# Patient Record
Sex: Female | Born: 1950
Health system: Southern US, Community
[De-identification: ages and names within clinical notes are randomized; demographics above are authoritative.]

## PROBLEM LIST (undated history)

## (undated) DIAGNOSIS — IMO0001 Reserved for inherently not codable concepts without codable children: Secondary | ICD-10-CM

## (undated) DIAGNOSIS — K219 Gastro-esophageal reflux disease without esophagitis: Secondary | ICD-10-CM

## (undated) DIAGNOSIS — C44320 Squamous cell carcinoma of skin of unspecified parts of face: Secondary | ICD-10-CM

## (undated) DIAGNOSIS — E611 Iron deficiency: Secondary | ICD-10-CM

## (undated) DIAGNOSIS — M7502 Adhesive capsulitis of left shoulder: Secondary | ICD-10-CM

## (undated) DIAGNOSIS — M797 Fibromyalgia: Secondary | ICD-10-CM

## (undated) DIAGNOSIS — K589 Irritable bowel syndrome without diarrhea: Secondary | ICD-10-CM

## (undated) DIAGNOSIS — T7840XA Allergy, unspecified, initial encounter: Secondary | ICD-10-CM

## (undated) DIAGNOSIS — G43909 Migraine, unspecified, not intractable, without status migrainosus: Secondary | ICD-10-CM

## (undated) DIAGNOSIS — H409 Unspecified glaucoma: Secondary | ICD-10-CM

## (undated) DIAGNOSIS — F419 Anxiety disorder, unspecified: Secondary | ICD-10-CM

## (undated) DIAGNOSIS — J45909 Unspecified asthma, uncomplicated: Secondary | ICD-10-CM

## (undated) DIAGNOSIS — R001 Bradycardia, unspecified: Secondary | ICD-10-CM

## (undated) DIAGNOSIS — K635 Polyp of colon: Secondary | ICD-10-CM

## (undated) HISTORY — DX: Anxiety disorder, unspecified: F41.9

## (undated) HISTORY — DX: Reserved for inherently not codable concepts without codable children: IMO0001

## (undated) HISTORY — DX: Adhesive capsulitis of left shoulder: M75.02

## (undated) HISTORY — PX: MOHS SURGERY: SHX181

## (undated) HISTORY — DX: Fibromyalgia: M79.7

## (undated) HISTORY — DX: Migraine, unspecified, not intractable, without status migrainosus: G43.909

## (undated) HISTORY — DX: Unspecified glaucoma: H40.9

## (undated) HISTORY — PX: TONSILLECTOMY: SUR1361

## (undated) HISTORY — DX: Squamous cell carcinoma of skin of unspecified parts of face: C44.320

## (undated) HISTORY — DX: Iron deficiency: E61.1

## (undated) HISTORY — DX: Unspecified asthma, uncomplicated: J45.909

## (undated) HISTORY — DX: Gastro-esophageal reflux disease without esophagitis: K21.9

## (undated) HISTORY — DX: Bradycardia, unspecified: R00.1

## (undated) HISTORY — DX: Allergy, unspecified, initial encounter: T78.40XA

## (undated) HISTORY — DX: Polyp of colon: K63.5

## (undated) HISTORY — DX: Irritable bowel syndrome, unspecified: K58.9

---
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
## 1997-03-07 HISTORY — PX: TENNIS ELBOW RELEASE/NIRSCHEL PROCEDURE: SHX6651

## 1997-07-10 ENCOUNTER — Ambulatory Visit (HOSPITAL_COMMUNITY): Admission: RE | Admit: 1997-07-10 | Discharge: 1997-07-10 | Payer: Self-pay | Admitting: Internal Medicine

## 1997-08-29 ENCOUNTER — Other Ambulatory Visit: Admission: RE | Admit: 1997-08-29 | Discharge: 1997-08-29 | Payer: Self-pay | Admitting: Obstetrics and Gynecology

## 1998-01-20 ENCOUNTER — Other Ambulatory Visit: Admission: RE | Admit: 1998-01-20 | Discharge: 1998-01-20 | Payer: Self-pay | Admitting: Obstetrics and Gynecology

## 1998-02-19 ENCOUNTER — Other Ambulatory Visit: Admission: RE | Admit: 1998-02-19 | Discharge: 1998-02-19 | Payer: Self-pay | Admitting: Obstetrics and Gynecology

## 1998-09-24 ENCOUNTER — Ambulatory Visit (HOSPITAL_BASED_OUTPATIENT_CLINIC_OR_DEPARTMENT_OTHER): Admission: RE | Admit: 1998-09-24 | Discharge: 1998-09-24 | Payer: Self-pay | Admitting: Oral Surgery

## 1999-01-22 ENCOUNTER — Other Ambulatory Visit: Admission: RE | Admit: 1999-01-22 | Discharge: 1999-01-22 | Payer: Self-pay | Admitting: Obstetrics and Gynecology

## 2000-02-15 ENCOUNTER — Other Ambulatory Visit: Admission: RE | Admit: 2000-02-15 | Discharge: 2000-02-15 | Payer: Self-pay | Admitting: Obstetrics and Gynecology

## 2001-02-22 ENCOUNTER — Other Ambulatory Visit: Admission: RE | Admit: 2001-02-22 | Discharge: 2001-02-22 | Payer: Self-pay | Admitting: Obstetrics and Gynecology

## 2001-07-03 ENCOUNTER — Encounter (INDEPENDENT_AMBULATORY_CARE_PROVIDER_SITE_OTHER): Payer: Self-pay | Admitting: *Deleted

## 2001-07-03 ENCOUNTER — Ambulatory Visit (HOSPITAL_BASED_OUTPATIENT_CLINIC_OR_DEPARTMENT_OTHER): Admission: RE | Admit: 2001-07-03 | Discharge: 2001-07-03 | Payer: Self-pay | Admitting: Plastic Surgery

## 2002-05-01 ENCOUNTER — Other Ambulatory Visit: Admission: RE | Admit: 2002-05-01 | Discharge: 2002-05-01 | Payer: Self-pay | Admitting: Obstetrics and Gynecology

## 2003-05-26 ENCOUNTER — Other Ambulatory Visit: Admission: RE | Admit: 2003-05-26 | Discharge: 2003-05-26 | Payer: Self-pay | Admitting: Obstetrics and Gynecology

## 2003-11-07 ENCOUNTER — Other Ambulatory Visit: Admission: RE | Admit: 2003-11-07 | Discharge: 2003-11-07 | Payer: Self-pay | Admitting: Obstetrics and Gynecology

## 2004-05-26 ENCOUNTER — Other Ambulatory Visit: Admission: RE | Admit: 2004-05-26 | Discharge: 2004-05-26 | Payer: Self-pay | Admitting: Obstetrics and Gynecology

## 2005-06-01 ENCOUNTER — Other Ambulatory Visit: Admission: RE | Admit: 2005-06-01 | Discharge: 2005-06-01 | Payer: Self-pay | Admitting: Obstetrics and Gynecology

## 2008-11-17 ENCOUNTER — Encounter: Admission: RE | Admit: 2008-11-17 | Discharge: 2008-11-17 | Payer: Self-pay | Admitting: Orthopedic Surgery

## 2009-06-15 ENCOUNTER — Encounter: Admission: RE | Admit: 2009-06-15 | Discharge: 2009-06-15 | Payer: Self-pay | Admitting: Orthopedic Surgery

## 2009-11-06 ENCOUNTER — Encounter: Admission: RE | Admit: 2009-11-06 | Discharge: 2009-11-06 | Payer: Self-pay | Admitting: Allergy and Immunology

## 2010-07-23 NOTE — Op Note (Signed)
Brenda Smith. [facility]  Patient:    [name], [name] Visit Number: [id number] MRN: [id number]          Service Type: DSU Location: [facility] Attending Physician:  Eloise Levels Dictated by:   Mary A. Contogiannis, M.D. Proc. Date: 07/03/01 Admit Date:  07/03/2001 Discharge Date: 07/03/2001                             Operative Report  PREOPERATIVE DIAGNOSIS:  Squamous cell carcinoma in situ, left chin.  POSTOPERATIVE DIAGNOSIS:  Squamous cell carcinoma in situ, left chin.  PROCEDURES: 1. Excision of 0.9-cm squamous cell carcinoma in situ, left chin, with    intraoperative frozen section diagnosis. 2. Intermediate repair of 1.2-cm incision, left chin.  ATTENDING SURGEON:  Mary A. Contogiannis, M.D.  ANESTHESIA:  One percent lidocaine with epinephrine.  COMPLICATIONS:  None.  INDICATIONS FOR THE PROCEDURE:  The patient is a 60 year old Caucasian female who has a biopsy-proven squamous cell carcinoma in situ of the left chin.  She now presents and requests to have this excised.  PROCEDURE:  The patient was brought into the minor procedure room, placed on the table in the supine position.  The face was prepped with Betadine and draped in sterile fashion.  The skin and subcutaneous tissues in the area of the squamous cell cancer was then injected with 1% lidocaine with epinephrine.  After adequate hemostasis and anesthesia had taken effect, the procedure was begun.  Using loupe magnification, the squamous cell cancer and its biopsy site were identified.  A 1-mm margin was then marked circumferentially around the lesion.  This was then excised, and the specimen marked at the 12 oclock position.  The specimen was then sent to the pathologist for an intraoperative frozen section diagnosis.  After a consultation with the pathologist, he felt that all of the squamous cell cancer had been removed and that the surgical margins were clear and free of any  tumor.  I, therefore, proceeded with closure of the wound.  The wound appeared to close easiest in the vertical direction.  The deep subcutaneous tissues were closed with a 4-0 Monocryl suture.  The dermal layer was then closed using 4-0 Monocryl interrupted sutures.  The skin was then closed with a 6-0 Prolene running baseball-type stitch.  The incision was dressed with bacitracin ointment and a light bandage.  There were no complications.  The patient tolerated the procedure well.  The patient was then taught proper wound care and discharged home in stable condition.  Follow-up appointment will be tomorrow in the office. Dictated by:   Mary A. Contogiannis, M.D. Attending Physician:  Eloise Levels DD:  07/05/01 TD:  07/08/01 Job: [id number]

## 2011-10-13 ENCOUNTER — Ambulatory Visit (INDEPENDENT_AMBULATORY_CARE_PROVIDER_SITE_OTHER): Payer: BC Managed Care – PPO | Admitting: Licensed Clinical Social Worker

## 2011-10-13 DIAGNOSIS — IMO0002 Reserved for concepts with insufficient information to code with codable children: Secondary | ICD-10-CM

## 2011-10-20 ENCOUNTER — Ambulatory Visit: Payer: Self-pay | Admitting: Licensed Clinical Social Worker

## 2011-11-03 ENCOUNTER — Ambulatory Visit (INDEPENDENT_AMBULATORY_CARE_PROVIDER_SITE_OTHER): Payer: BC Managed Care – PPO | Admitting: Licensed Clinical Social Worker

## 2011-11-03 DIAGNOSIS — IMO0002 Reserved for concepts with insufficient information to code with codable children: Secondary | ICD-10-CM

## 2011-11-17 ENCOUNTER — Ambulatory Visit (INDEPENDENT_AMBULATORY_CARE_PROVIDER_SITE_OTHER): Payer: BC Managed Care – PPO | Admitting: Licensed Clinical Social Worker

## 2011-11-17 DIAGNOSIS — IMO0002 Reserved for concepts with insufficient information to code with codable children: Secondary | ICD-10-CM

## 2011-12-01 ENCOUNTER — Ambulatory Visit (INDEPENDENT_AMBULATORY_CARE_PROVIDER_SITE_OTHER): Payer: BC Managed Care – PPO | Admitting: Licensed Clinical Social Worker

## 2011-12-01 DIAGNOSIS — IMO0002 Reserved for concepts with insufficient information to code with codable children: Secondary | ICD-10-CM

## 2011-12-20 DIAGNOSIS — M79609 Pain in unspecified limb: Secondary | ICD-10-CM | POA: Insufficient documentation

## 2011-12-20 DIAGNOSIS — G43009 Migraine without aura, not intractable, without status migrainosus: Secondary | ICD-10-CM | POA: Insufficient documentation

## 2012-07-17 ENCOUNTER — Encounter: Payer: Self-pay | Admitting: Neurology

## 2012-07-17 DIAGNOSIS — G43009 Migraine without aura, not intractable, without status migrainosus: Secondary | ICD-10-CM

## 2012-07-17 DIAGNOSIS — M79609 Pain in unspecified limb: Secondary | ICD-10-CM

## 2012-07-18 ENCOUNTER — Encounter: Payer: Self-pay | Admitting: Neurology

## 2012-07-18 ENCOUNTER — Ambulatory Visit (INDEPENDENT_AMBULATORY_CARE_PROVIDER_SITE_OTHER): Payer: BC Managed Care – PPO | Admitting: Neurology

## 2012-07-18 VITALS — BP 110/74 | HR 50 | Ht 63.0 in | Wt 119.0 lb

## 2012-07-18 DIAGNOSIS — G43009 Migraine without aura, not intractable, without status migrainosus: Secondary | ICD-10-CM

## 2012-07-18 DIAGNOSIS — IMO0001 Reserved for inherently not codable concepts without codable children: Secondary | ICD-10-CM

## 2012-07-18 DIAGNOSIS — M791 Myalgia, unspecified site: Secondary | ICD-10-CM | POA: Insufficient documentation

## 2012-07-18 HISTORY — DX: Reserved for inherently not codable concepts without codable children: IMO0001

## 2012-07-18 MED ORDER — TIZANIDINE HCL 2 MG PO TABS: 1.0000 mg | ORAL_TABLET | Freq: Three times a day (TID) | ORAL | Status: DC | PRN

## 2012-07-18 NOTE — Progress Notes (Signed)
Reason for visit: Fibromyalgia  Brenda Smith is an 62 y.o. female  History of present illness:  Brenda Smith is a 62 year old right-handed white female with a history of migraine headaches and fibromyalgia. Since last seen, the patient has done relatively well, and she indicates that her true migraine headaches are quite infrequent, and have occurred only on 3 or 4 occasions since August of 2013. The patient indicates however, that she is having virtually daily headaches that occur in the middle the night, and these occur on the left side the head and are occasionally associated with nausea. The patient will use ice packs and muscle relaxation techniques for these headaches. The patient will take tizanidine, one half of a 2 mg tablet for her fibromyalgia pain, and she has noted some improvement with this medication. The patient has not been able to tolerate multiple medications previously that includes nortriptyline, Topamax, propranolol, Zonegran, Lexapro, or Zoloft. The patient returns for an evaluation.  Past Medical History  Diagnosis Date  . Migraine headache   . Anxiety disorder   . IBS (irritable bowel syndrome)   . Myalgia and myositis, unspecified     Past Surgical History  Procedure Laterality Date  . Tonsillectomy    . Mohs surgery      Squamous cell    Family History  Problem Relation Age of Onset  . Heart attack Mother   . Cancer Mother   . Heart attack Father   . Gout Father   . Anxiety disorder Father   . Heart disease Brother   . Headache Maternal Grandmother   . Heart disease Maternal Grandmother     Social history:  reports that she has never smoked. She does not have any smokeless tobacco history on file. She reports that  drinks alcohol. Her drug history is not on file.  Allergies:  Allergies  Allergen Reactions  . Penicillins   . Pamelor (Nortriptyline Hcl)   . Topamax (Topiramate)     Medications:  No current outpatient prescriptions on  file prior to visit.   No current facility-administered medications on file prior to visit.    ROS:  Out of a complete 14 system review of symptoms, the patient complains only of the following symptoms, and all other reviewed systems are negative.  Fatigue Ringing in the ears Shortness of breath Feeling cold, thirst Allergies, skin sensitivity Headache Anxiety, insomnia  Blood pressure 110/74, pulse 50, height 5\' 3"  (1.6 m), weight 119 lb (53.978 kg).  Physical Exam  General: The patient is alert and cooperative at the time of the examination.  Skin: No significant peripheral edema is noted.   Neurologic Exam  Cranial nerves: Facial symmetry is present. Speech is normal, no aphasia or dysarthria is noted. Extraocular movements are full. Visual fields are full.  Motor: The patient has good strength in all 4 extremities.  Coordination: The patient has good finger-nose-finger and heel-to-shin bilaterally.  Gait and station: The patient has a normal gait. Tandem gait is normal. Romberg is negative. No drift is seen.  Reflexes: Deep tendon reflexes are symmetric.   Assessment/Plan:  1. Migraine headache  2. Fibromyalgia  The patient will begin taking tizanidine, one half of a 2 mg tablet at night on a scheduled basis. The patient also takes lorazepam, one half of a 1 mg tablet at night. The patient will followup through this office in 6 months. Overall, the patient has done fairly well. A prescription was sent in for the tizanidine.  Frederico Hamman  Anne Hahn MD 07/18/2012 8:09 PM  [facility] [address] [place], [place] [address]  Phone [phone] Fax [phone]

## 2013-01-17 ENCOUNTER — Encounter: Payer: Self-pay | Admitting: Nurse Practitioner

## 2013-01-17 ENCOUNTER — Ambulatory Visit (INDEPENDENT_AMBULATORY_CARE_PROVIDER_SITE_OTHER): Payer: BC Managed Care – PPO | Admitting: Nurse Practitioner

## 2013-01-17 VITALS — BP 110/70 | HR 53 | Temp 97.6°F | Ht 63.0 in | Wt 123.0 lb

## 2013-01-17 DIAGNOSIS — F411 Generalized anxiety disorder: Secondary | ICD-10-CM

## 2013-01-17 DIAGNOSIS — IMO0001 Reserved for inherently not codable concepts without codable children: Secondary | ICD-10-CM

## 2013-01-17 DIAGNOSIS — G43009 Migraine without aura, not intractable, without status migrainosus: Secondary | ICD-10-CM

## 2013-01-17 MED ORDER — TIZANIDINE HCL 2 MG PO TABS: 1.0000 mg | ORAL_TABLET | Freq: Three times a day (TID) | ORAL | Status: DC | PRN

## 2013-01-17 NOTE — Progress Notes (Signed)
[facility]  PATIENT: Brenda Smith DOB: June 05, 1950   REASON FOR VISIT: follow up HISTORY FROM: patient  HISTORY OF PRESENT ILLNESS: 07/18/12 Brenda Smith):  Brenda Smith is a 62 year old right-handed white female with a history of migraine headaches and fibromyalgia. Since last seen, the patient has done relatively well, and she indicates that her true migraine headaches are quite infrequent, and have occurred only on 3 or 4 occasions since August of 2013. The patient indicates however, that she is having virtually daily headaches that occur in the middle the night, and these occur on the left side the head and are occasionally associated with nausea. The patient will use ice packs and muscle relaxation techniques for these headaches. The patient will take tizanidine, one half of a 2 mg tablet for her fibromyalgia pain, and she has noted some improvement with this medication. The patient has not been able to tolerate multiple medications previously that includes nortriptyline, Topamax, propranolol, Zonegran, Lexapro, or Zoloft. The patient returns for an evaluation.  UPDATE 01/17/13 (LL):  Brenda Smith returns for follow up for migraines and fibromyalgia-type pain.  Since last visit, she has continued to have almost nightly left-temporal headache that wakes her between 3-4 am with associated symptoms of nausea, chills, and eye-watering.  She states these headaches usually last about an hour.  She has used ice packs and relaxation exercises to deal with these headaches.  She has been "hesitant" to use the Tizanidine for pain, because she states she is very sensitive to medication.  Over the past months she has had many older aches and pains resurface, (sacral, in-between shoulders) which she sought treatment for at [facility] in [place].  She has been involved in yoga, massage and mediation to help get her symptoms under control.  She admits having a lot of anxieties and  had trouble adjusting to life without working full-time.  REVIEW OF SYSTEMS: Full 14 system review of systems performed and notable only for: headaches, anxiety  ALLERGIES: Allergies  Allergen Reactions  . Penicillins   . Pamelor [Nortriptyline Hcl]   . Topamax [Topiramate]     HOME MEDICATIONS: Outpatient Prescriptions Prior to Visit  Medication Sig Dispense Refill  . cetirizine (ZYRTEC) 10 MG tablet Take 10 mg by mouth daily as needed.       Marland Kitchen HYDROcodone-acetaminophen (NORCO) 10-325 MG per tablet Take 1 tablet by mouth every 6 (six) hours as needed for pain.      Marland Kitchen LORazepam (ATIVAN) 1 MG tablet Take 0.5 mg by mouth every 8 (eight) hours as needed. 1 1/2 tablet daily      . tiZANidine (ZANAFLEX) 2 MG tablet Take 0.5 tablets (1 mg total) by mouth every 8 (eight) hours as needed.  45 tablet  5   . fluocinonide ointment (LIDEX) 0.05 %    Sig: Apply 1 application topically at bedtime as needed.  . desonide (DESOWEN) 0.05 % ointment    Sig: Apply 1 application topically 2 (two) times daily.    PAST MEDICAL HISTORY: Past Medical History  Diagnosis Date  . Migraine headache   . Anxiety disorder   . IBS (irritable bowel syndrome)   . Myalgia and myositis, unspecified     PAST SURGICAL HISTORY: Past Surgical History  Procedure Laterality Date  . Tonsillectomy    . Mohs surgery      Squamous cell    FAMILY HISTORY: Family History  Problem Relation Age of Onset  . Heart attack Mother   . Cancer  Mother   . Heart attack Father   . Gout Father   . Anxiety disorder Father   . Heart disease Brother   . Headache Maternal Grandmother   . Heart disease Maternal Grandmother     SOCIAL HISTORY: History   Social History  . Marital Status: Married    Spouse Name: N/A    Number of Children: 0  . Years of Education: PHD   Occupational History  . Not on file.   Social History Main Topics  . Smoking status: Never Smoker   . Smokeless tobacco: Not on file  . Alcohol Use:  Yes     Comment: Drinks alcohol on occasion  . Drug Use: No  . Sexual Activity: No   Other Topics Concern  . Not on file   Social History Narrative  . No narrative on file     PHYSICAL EXAM  Filed Vitals:   01/17/13 1020  BP: 110/70  Pulse: 53  Temp: 97.6 F (36.4 C)  TempSrc: Oral  Height: 5\' 3"  (1.6 m)  Weight: 123 lb (55.792 kg)   Body mass index is 21.79 kg/(m^2).  Generalized: Well developed, in no acute distress  Head: normocephalic and atraumatic. Oropharynx benign  Neck: Supple, no carotid bruits  Cardiac: Regular rate rhythm, no murmur  Musculoskeletal: No deformity   Neurological examination  Mentation: Alert oriented to time, place, history taking. Follows all commands speech and language fluent Cranial nerve II-XII: Pupils were equal round reactive to light extraocular movements were full, visual field were full on confrontational test. Facial sensation and strength were normal. hearing was intact to finger rubbing bilaterally. Uvula tongue midline. head turning and shoulder shrug and were normal and symmetric.Tongue protrusion into cheek strength was normal. Motor: normal bulk and tone, full strength in the BUE, BLE, fine finger movements normal, no pronator drift. No focal weakness Sensory: normal and symmetric to light touch Coordination: normal finger-nose-finger, heel-to-shin bilaterally, no dysmetria Reflexes:  Deep tendon reflexes in the upper and lower extremities are present and symmetric.  Gait and Station: Rising up from seated position without assistance, normal stance, without trunk ataxia, moderate stride, good arm swing, smooth turning, able to perform tiptoe, and heel walking without difficulty.   ASSESSMENT AND PLAN Brenda Smith is a 62 y.o. year old Caucasian female who has a past medical history of Migraine headache; Anxiety disorder; IBS (irritable bowel syndrome); Myalgia and myositis.  Her headaches likely have a large  tension/anxiety component.    I discussed with her that she would probably benefit from taking a daily SNRI such as Savella or an SSRI such as Zoloft, which she did not take an adequate time to be effective in the past.  She is open to doing so but would like to discuss this further with her primary provider, Dr. Kevan Ny.  I recommended she use the tizanidine, one half of a 2 mg tablet at night on a scheduled basis to help relieve muscle tension. The patient also takes lorazepam, one half of a 1 mg tablet at night.  The patient will followup through this office in 6 months. A prescription was sent in for the tizanidine.   Meds ordered this encounter  Medications  . tiZANidine (ZANAFLEX) 2 MG tablet    Sig: Take 0.5 tablets (1 mg total) by mouth every 8 (eight) hours as needed.    Dispense:  45 tablet    Refill:  5    Order Specific Question:  Supervising Provider  Answer:  York Spaniel [[id number]]    Ronal Fear, MSN, NP-C 01/17/2013, 10:34 AM [facility] [address] [place], [place] [address] [phone]  Note: This document was prepared with digital dictation and possible smart phrase technology. Any transcriptional errors that result from this process are unintentional.

## 2013-01-17 NOTE — Progress Notes (Signed)
I have read the note, and I agree with the clinical assessment and plan.  WILLIS,CHARLES KEITH   

## 2013-01-17 NOTE — Patient Instructions (Signed)
Continue Tizanidine for muscle tightness, I recommend using at night.  Speak to Dr, Kevan Ny about restarting Zoloft or trying one of the new SNRIs, as Savella.  Follow up in 6 months, sooner as needed.

## 2013-07-15 ENCOUNTER — Telehealth: Payer: Self-pay | Admitting: Nurse Practitioner

## 2013-07-15 NOTE — Telephone Encounter (Signed)
Noted ..Will Advise Charlott Holler

## 2013-07-15 NOTE — Telephone Encounter (Signed)
Patient has an appt on Thursday with Lynn--patient is having pain on the lt side of her neck below her ear--painful--wants to know on her last MRI did anything show up that would possibly be causing her to have pain there--patient does not need a call back but would like to discuss this on her appt Thursday.

## 2013-07-16 ENCOUNTER — Telehealth: Payer: Self-pay | Admitting: Nurse Practitioner

## 2013-07-16 NOTE — Telephone Encounter (Signed)
Patient was returning Brenda Smith's call from yesterday.  Please return call.  Thanks

## 2013-07-16 NOTE — Telephone Encounter (Signed)
Called patient and informed that message(07/15/13) was sent to Ms Chauncy Passy, she verbalized understanding and said that she wanted to make her aware before her visit on Thursday

## 2013-07-18 ENCOUNTER — Ambulatory Visit (INDEPENDENT_AMBULATORY_CARE_PROVIDER_SITE_OTHER): Payer: BC Managed Care – PPO | Admitting: Nurse Practitioner

## 2013-07-18 ENCOUNTER — Encounter (INDEPENDENT_AMBULATORY_CARE_PROVIDER_SITE_OTHER): Payer: Self-pay

## 2013-07-18 ENCOUNTER — Encounter: Payer: Self-pay | Admitting: Nurse Practitioner

## 2013-07-18 VITALS — BP 113/69 | HR 48 | Ht 63.0 in | Wt 123.0 lb

## 2013-07-18 DIAGNOSIS — M542 Cervicalgia: Secondary | ICD-10-CM

## 2013-07-18 DIAGNOSIS — G43009 Migraine without aura, not intractable, without status migrainosus: Secondary | ICD-10-CM

## 2013-07-18 DIAGNOSIS — IMO0001 Reserved for inherently not codable concepts without codable children: Secondary | ICD-10-CM

## 2013-07-18 NOTE — Patient Instructions (Signed)
We will order MRI of your cervical spine, someone will call to schedule after approval with insurance.    Continue with [facility].  Continue to use Tizanidine as needed for headache, neck spasms or back spasms.  Follow up in 6 months, sooner as needed.   Muscle Cramps and Spasms Muscle cramps and spasms occur when a muscle or muscles tighten and you have no control over this tightening (involuntary muscle contraction). They are a common problem and can develop in any muscle. The most common place is in the calf muscles of the leg. Both muscle cramps and muscle spasms are involuntary muscle contractions, but they also have differences:   Muscle cramps are sporadic and painful. They may last a few seconds to a quarter of an hour. Muscle cramps are often more forceful and last longer than muscle spasms.  Muscle spasms may or may not be painful. They may also last just a few seconds or much longer. CAUSES  It is uncommon for cramps or spasms to be due to a serious underlying problem. In many cases, the cause of cramps or spasms is unknown. Some common causes are:   Overexertion.   Overuse from repetitive motions (doing the same thing over and over).   Remaining in a certain position for a long period of time.   Improper preparation, form, or technique while performing a sport or activity.   Dehydration.   Injury.   Side effects of some medicines.   Abnormally low levels of the salts and ions in your blood (electrolytes), especially potassium and calcium. This could happen if you are taking water pills (diuretics) or you are pregnant.  Some underlying medical problems can make it more likely to develop cramps or spasms. These include, but are not limited to:   Diabetes.   Parkinson disease.   Hormone disorders, such as thyroid problems.   Alcohol abuse.   Diseases specific to muscles, joints, and bones.   Blood vessel disease where not enough blood is  getting to the muscles.  HOME CARE INSTRUCTIONS   Stay well hydrated. Drink enough water and fluids to keep your urine clear or pale yellow.  It may be helpful to massage, stretch, and relax the affected muscle.  For tight or tense muscles, use a warm towel, heating pad, or hot shower water directed to the affected area.  If you are sore or have pain after a cramp or spasm, applying ice to the affected area may relieve discomfort.  Put ice in a plastic bag.  Place a towel between your skin and the bag.  Leave the ice on for 15-20 minutes, 03-04 times a day.  Medicines used to treat a known cause of cramps or spasms may help reduce their frequency or severity. Only take over-the-counter or prescription medicines as directed by your caregiver. SEEK MEDICAL CARE IF:  Your cramps or spasms get more severe, more frequent, or do not improve over time.  MAKE SURE YOU:   Understand these instructions.  Will watch your condition.  Will get help right away if you are not doing well or get worse. Document Released: 08/13/2001 Document Revised: 06/18/2012 Document Reviewed: 02/08/2012 [facility] Patient Information 2014 [place], [place].

## 2013-07-18 NOTE — Progress Notes (Signed)
I have read the note, and I agree with the clinical assessment and plan.  Charles K Willis   

## 2013-07-18 NOTE — Progress Notes (Addendum)
Brenda Smith: Brenda Smith DOB: [birth year]  REASON FOR VISIT: follow up for Migraines, myositis HISTORY FROM: Brenda Smith  HISTORY OF PRESENT ILLNESS: 07/18/12 Brenda Smith): Brenda Smith is a 63 year old right-handed white female with a history of migraine headaches and fibromyalgia. Since last seen, the Brenda Smith has done relatively well, and she indicates that Brenda Smith true migraine headaches are quite infrequent, and have occurred only on 3 or 4 occasions since August of 2013. The Brenda Smith indicates however, that she is having virtually daily headaches that occur in the middle the night, and these occur on the left side the head and are occasionally associated with nausea. The Brenda Smith will use ice packs and muscle relaxation techniques for these headaches. The Brenda Smith will take tizanidine, one half of a 2 mg tablet for Brenda Smith fibromyalgia pain, and she has noted some improvement with this medication. The Brenda Smith has not been able to tolerate multiple medications previously that includes nortriptyline, Topamax, propranolol, Zonegran, Lexapro, or Zoloft. The Brenda Smith returns for an evaluation.   UPDATE 01/17/13 (LL): Brenda Smith returns for follow up for migraines and fibromyalgia-type pain. Since last visit, she has continued to have almost nightly left-temporal headache that wakes Brenda Smith between 3-4 am with associated symptoms of nausea, chills, and eye-watering. She states these headaches usually last about an hour. She has used ice packs and relaxation exercises to deal with these headaches. She has been "hesitant" to use the Tizanidine for pain, because she states she is very sensitive to medication. Over the past months she has had many older aches and pains resurface, (sacral, in-between shoulders) which she sought treatment for at [facility] in [place]. She has been involved in yoga, massage and mediation to help get Brenda Smith symptoms under control. She admits having a lot of anxieties and had trouble  adjusting to life without working full-time.   UPDATE 07/18/13 (LL): Brenda Smith is having pain on the lt side of Brenda Smith neck below Brenda Smith ear, along with increased neck pain and pain between Brenda Smith shoulder blades.  She has tried to increase Brenda Smith exercise lately, trying tennis again, but felt very sore afterwards.  She has been having shooting pains from Brenda Smith neck radiating "in all directions."  She continues holistic therapies to try to reduce tension. She reports 6 headaches in January, 2 in Feb., 1 migraine in march plus another headache, 2 migraine and 2 headache in April, and 3 headaches so far in May.  REVIEW OF SYSTEMS: Full 14 system review of systems performed and notable only for: headaches, anxiety   ALLERGIES: Allergies  Allergen Reactions  . Penicillins   . Pamelor [Nortriptyline Hcl]   . Topamax [Topiramate]     HOME MEDICATIONS: Outpatient Prescriptions Prior to Visit  Medication Sig Dispense Refill  . cetirizine (ZYRTEC) 10 MG tablet Take 10 mg by mouth daily as needed.       . desonide (DESOWEN) 0.05 % ointment Apply 1 application topically 2 (two) times daily.      . fluocinonide ointment (LIDEX) 3.61 % Apply 1 application topically at bedtime as needed.      Marland Kitchen HYDROcodone-acetaminophen (NORCO) 10-325 MG per tablet Take 1 tablet by mouth every 6 (six) hours as needed for pain.      Marland Kitchen LORazepam (ATIVAN) 1 MG tablet Take 0.5 mg by mouth every 8 (eight) hours as needed. 1 1/2 tablet daily      . tiZANidine (ZANAFLEX) 2 MG tablet Take 0.5 tablets (1 mg total) by mouth every 8 (eight) hours as needed.  45 tablet  5   No facility-administered medications prior to visit.     PHYSICAL EXAM  Filed Vitals:   07/18/13 1103  BP: 113/69  Pulse: 48  Height: 5\' 3"  (1.6 m)  Weight: 123 lb (55.792 kg)   Body mass index is 21.79 kg/(m^2).  Generalized: Well developed, in no acute distress  Head: normocephalic and atraumatic. Oropharynx benign  Neck: Supple, no carotid bruits  Cardiac:  Regular rate rhythm, no murmur  Musculoskeletal: No deformity   Neurological examination  Mentation: Alert oriented to time, place, history taking. Follows all commands speech and language fluent  Cranial nerve II-XII: Pupils were equal round reactive to light extraocular movements were full, visual field were full on confrontational test. Facial sensation and strength were normal. hearing was intact to finger rubbing bilaterally. Uvula tongue midline. head turning and shoulder shrug and were normal and symmetric.Tongue protrusion into cheek strength was normal.  Motor: normal bulk and tone, full strength in the BUE, BLE, fine finger movements normal, no pronator drift. No focal weakness  Sensory: normal and symmetric to light touch  Coordination: normal finger-nose-finger, heel-to-shin bilaterally, no dysmetria  Reflexes: Deep tendon reflexes in the upper and lower extremities are present and symmetric.  Gait and Station: Rising up from seated position without assistance, normal stance, without trunk ataxia, moderate stride, good arm swing, smooth turning, able to perform tiptoe, and heel walking without difficulty.    ASSESSMENT AND PLAN 63 y.o. year old female  has a past medical history of Migraine headache; Anxiety disorder; IBS (irritable bowel syndrome); and Myalgia and myositis, unspecified. Brenda Smith headaches likely have a large tension/anxiety component. Now with increased neck pain and muscle spasm in neck.  PLAN: MRI Cervical Spine Wo to evaluate neck pain and spasms, headaches Continue with [facility]. Continue to use Tizanidine as needed for headache, neck spasms or back spasms, this has been helpful. Follow up in 6 months, sooner as needed.  Orders Placed This Encounter  Procedures  . MR Cervical Spine Wo Contrast   Return in about 6 months (around 01/18/2014) for Migraine, Neck Spasms  Jenniferlynn Saad E. Kwanza Cancelliere, MSN, NP-C 07/18/2013, 12:09 PM [facility] [address], [place], [place] [address] [phone]  Note: This document was prepared with digital dictation and possible smart phrase technology. Any transcriptional errors that result from this process are unintentional.

## 2013-07-19 ENCOUNTER — Telehealth: Payer: Self-pay | Admitting: Nurse Practitioner

## 2013-07-19 NOTE — Telephone Encounter (Signed)
Pt called states she does not remember discussing Fibromyalgia, pt would like for NP/LL to call her explaining why this is on her report. Please call pt concerning this matter. Thanks

## 2013-07-19 NOTE — Telephone Encounter (Signed)
Called and left message regarding patient's concern that fibromyalgia was on here revisit note, I did not actually mean to enter that, it should have read muscle spasms. She is to call back Monday if she still has concerns.

## 2013-07-25 ENCOUNTER — Ambulatory Visit (INDEPENDENT_AMBULATORY_CARE_PROVIDER_SITE_OTHER): Payer: BC Managed Care – PPO

## 2013-07-25 DIAGNOSIS — IMO0001 Reserved for inherently not codable concepts without codable children: Secondary | ICD-10-CM

## 2013-07-25 DIAGNOSIS — M542 Cervicalgia: Secondary | ICD-10-CM

## 2013-07-25 DIAGNOSIS — G43009 Migraine without aura, not intractable, without status migrainosus: Secondary | ICD-10-CM

## 2013-07-31 NOTE — Progress Notes (Signed)
Quick Note:  Shared normal MR Cervical spine with patient and she verbalized understanding ______

## 2013-10-04 ENCOUNTER — Encounter: Payer: Self-pay | Admitting: Neurology

## 2014-01-22 ENCOUNTER — Encounter: Payer: Self-pay | Admitting: Neurology

## 2014-01-24 ENCOUNTER — Ambulatory Visit: Payer: BC Managed Care – PPO | Admitting: Neurology

## 2014-01-24 ENCOUNTER — Ambulatory Visit (INDEPENDENT_AMBULATORY_CARE_PROVIDER_SITE_OTHER): Payer: BC Managed Care – PPO | Admitting: Nurse Practitioner

## 2014-01-24 ENCOUNTER — Encounter: Payer: Self-pay | Admitting: Nurse Practitioner

## 2014-01-24 VITALS — BP 116/77 | HR 50 | Ht 63.0 in | Wt 128.2 lb

## 2014-01-24 DIAGNOSIS — IMO0001 Reserved for inherently not codable concepts without codable children: Secondary | ICD-10-CM

## 2014-01-24 DIAGNOSIS — M609 Myositis, unspecified: Secondary | ICD-10-CM

## 2014-01-24 DIAGNOSIS — M791 Myalgia: Secondary | ICD-10-CM

## 2014-01-24 DIAGNOSIS — G43009 Migraine without aura, not intractable, without status migrainosus: Secondary | ICD-10-CM

## 2014-01-24 MED ORDER — TIZANIDINE HCL 2 MG PO TABS: 1.0000 mg | ORAL_TABLET | Freq: Three times a day (TID) | ORAL | Status: DC | PRN

## 2014-01-24 NOTE — Progress Notes (Signed)
I have read the note, and I agree with the clinical assessment and plan.  Kru Allman KEITH   

## 2014-01-24 NOTE — Progress Notes (Signed)
PATIENT: Brenda Smith DOB: [birth year]  REASON FOR VISIT: routine follow up for Migraines, Myalgia and Myositis HISTORY FROM: patient  HISTORY OF PRESENT ILLNESS: 07/18/12 Amparo Bristol): Ms. Tallman is a 63 year old right-handed white female with a history of migraine headaches and Myalgia and Myositis. Since last seen, the patient has done relatively well, and she indicates that her true migraine headaches are quite infrequent, and have occurred only on 4 occasions since last seen. The patient indicates however, that she has had 12 generalized headaches of a mild nature. The patient will use ice packs, advil, rest and muscle relaxation techniques for these headaches. The patient will take tizanidine, one half of a 2 mg tablet for her Myalgia and Myositis, and she has noted some improvement with this medication. She has used only 4.5 tablets in the last 6 months. The patient has not been able to tolerate multiple medications previously that includes nortriptyline, Topamax, propranolol, Zonegran, Lexapro, or Zoloft.  She sought treatment for at [facility] in [place] last year and has been returning ever since. She has been involved in yoga, massage and mediation to help get her symptoms under control.  She continues holistic therapies to try to reduce tension. The patient returns for an evaluation.    REVIEW OF SYSTEMS: Full 14 system review of systems performed and notable only for: headaches, anxiety, light sensitivity, back pain, neck pain, ringing in ears, runny nose, shortness of breath, cold intolerance, Env Allergies.   ALLERGIES: Allergies  Allergen Reactions  . Penicillins   . Pamelor [Nortriptyline Hcl]   . Topamax [Topiramate]     HOME MEDICATIONS: Outpatient Prescriptions Prior to Visit  Medication Sig Dispense Refill  . cetirizine (ZYRTEC) 10 MG tablet Take 10 mg by mouth daily as needed.     . desonide (DESOWEN) 0.05 % ointment Apply 1 application topically 2 (two)  times daily.    . fluocinonide ointment (LIDEX) 5.62 % Apply 1 application topically at bedtime as needed.    Marland Kitchen HYDROcodone-acetaminophen (NORCO) 10-325 MG per tablet Take 1 tablet by mouth every 6 (six) hours as needed for pain.    Marland Kitchen LORazepam (ATIVAN) 1 MG tablet Take 0.5 mg by mouth every 8 (eight) hours as needed. 1 1/2 tablet daily    . XOPENEX HFA 45 MCG/ACT inhaler Inhale into the lungs as needed.    Marland Kitchen tiZANidine (ZANAFLEX) 2 MG tablet Take 0.5 tablets (1 mg total) by mouth every 8 (eight) hours as needed. 45 tablet 5   No facility-administered medications prior to visit.    PHYSICAL EXAM Filed Vitals:   01/24/14 1104  BP: 116/77  Pulse: 50  Height: 5\' 3"  (1.6 m)  Weight: 128 lb 3.2 oz (58.151 kg)   Body mass index is 22.72 kg/(m^2).  Generalized: Well developed, in no acute distress   Head: normocephalic and atraumatic. Oropharynx benign   Neck: Supple, no carotid bruits   Cardiac: Regular rate rhythm, no murmur   Musculoskeletal: No deformity   Neurological examination   Mentation: Alert oriented to time, place, history taking. Follows all commands speech and language fluent   Cranial nerve II-XII: Pupils were equal round reactive to light extraocular movements were full, visual field were full on confrontational test. Facial sensation and strength were normal. hearing was intact to finger rubbing bilaterally. Uvula tongue midline. head turning and shoulder shrug and were normal and symmetric.Tongue protrusion into cheek strength was normal.   Motor: normal bulk and tone, full strength in the BUE, BLE, fine  finger movements normal, no pronator drift. No focal weakness   Sensory: normal and symmetric to light touch   Coordination: normal finger-nose-finger, heel-to-shin bilaterally, no dysmetria   Reflexes: Deep tendon reflexes in the upper and lower extremities are present and symmetric.   Gait and Station: Rising up from seated position without assistance, normal stance,  without trunk ataxia, moderate stride, good arm swing, smooth turning, able to perform tiptoe, and heel walking without difficulty.   ASSESSMENT: 63 year old female has a past medical history of Migraine headache; Anxiety disorder; IBS (irritable bowel syndrome); and Myalgia and myositis, unspecified. Her headaches likely have a large tension/anxiety component. MRI Cervical Spine was unremarkable.  PLAN: Continue with [facility]. Continue to use Tizanidine as needed for headache, neck spasms or back spasms, this has been helpful. Follow up in 6 months with Dr. Jannifer Franklin, sooner as needed.   Meds ordered this encounter  Medications  . tiZANidine (ZANAFLEX) 2 MG tablet    Sig: Take 0.5 tablets (1 mg total) by mouth every 8 (eight) hours as needed.    Dispense:  45 tablet    Refill:  5    Order Specific Question:  Supervising Provider    Answer:  Andrey Spearman R [[id number]]   Rudi Rummage Donnice Nielsen, MSN, FNP-BC, A/GNP-C 01/24/2014, 11:14 AM [facility] [address], [place], [place] [address] [phone]  Note: This document was prepared with digital dictation and possible smart phrase technology. Any transcriptional errors that result from this process are unintentional.

## 2014-01-24 NOTE — Patient Instructions (Signed)
Continue with [facility]. Continue to use Tizanidine as needed for headache, neck spasms or back spasms. Follow up in 6 months with Dr. Jannifer Franklin, sooner as needed.

## 2014-01-28 ENCOUNTER — Encounter: Payer: Self-pay | Admitting: Neurology

## 2014-03-25 ENCOUNTER — Other Ambulatory Visit: Payer: Self-pay | Admitting: Neurology

## 2014-03-25 ENCOUNTER — Ambulatory Visit
Admission: RE | Admit: 2014-03-25 | Discharge: 2014-03-25 | Disposition: A | Discharge status: Home / Self Care | Payer: BLUE CROSS/BLUE SHIELD | Source: Ambulatory Visit | Attending: Neurology | Admitting: Neurology

## 2014-03-25 ENCOUNTER — Telehealth: Payer: Self-pay | Admitting: *Deleted

## 2014-03-25 DIAGNOSIS — M542 Cervicalgia: Secondary | ICD-10-CM

## 2014-03-25 NOTE — Telephone Encounter (Signed)
Patient CD and report at the front desk.

## 2014-06-20 ENCOUNTER — Other Ambulatory Visit: Payer: Self-pay | Admitting: Orthopaedic Surgery

## 2014-06-20 DIAGNOSIS — M545 Low back pain: Secondary | ICD-10-CM

## 2014-07-11 ENCOUNTER — Ambulatory Visit
Admission: RE | Admit: 2014-07-11 | Discharge: 2014-07-11 | Disposition: A | Discharge status: Home / Self Care | Payer: BLUE CROSS/BLUE SHIELD | Source: Ambulatory Visit | Attending: Orthopaedic Surgery | Admitting: Orthopaedic Surgery

## 2014-07-11 DIAGNOSIS — M545 Low back pain: Secondary | ICD-10-CM

## 2014-07-28 ENCOUNTER — Ambulatory Visit: Payer: BC Managed Care – PPO | Admitting: Neurology

## 2015-04-22 ENCOUNTER — Ambulatory Visit (INDEPENDENT_AMBULATORY_CARE_PROVIDER_SITE_OTHER): Payer: BLUE CROSS/BLUE SHIELD | Admitting: Neurology

## 2015-04-22 ENCOUNTER — Encounter: Payer: Self-pay | Admitting: Neurology

## 2015-04-22 VITALS — BP 115/75 | HR 53 | Ht 64.0 in | Wt 126.5 lb

## 2015-04-22 DIAGNOSIS — IMO0001 Reserved for inherently not codable concepts without codable children: Secondary | ICD-10-CM

## 2015-04-22 DIAGNOSIS — M79609 Pain in unspecified limb: Secondary | ICD-10-CM

## 2015-04-22 DIAGNOSIS — M609 Myositis, unspecified: Secondary | ICD-10-CM

## 2015-04-22 DIAGNOSIS — G43009 Migraine without aura, not intractable, without status migrainosus: Secondary | ICD-10-CM | POA: Diagnosis not present

## 2015-04-22 DIAGNOSIS — M791 Myalgia: Secondary | ICD-10-CM

## 2015-04-22 MED ORDER — GABAPENTIN 100 MG PO CAPS: 100.0000 mg | ORAL_CAPSULE | Freq: Three times a day (TID) | ORAL | Status: DC

## 2015-04-22 NOTE — Patient Instructions (Signed)

## 2015-04-22 NOTE — Progress Notes (Signed)
Reason for visit: Neuromuscular discomfort  Referring physician: Dr. Darcus Austin  Brenda Smith is a 65 y.o. female  History of present illness:  Brenda Smith is a 65 year old right-handed white female with a history of migraine headaches. The patient was last seen through this office in November 2015. The patient indicates that her migraine headaches have become very irregular. She has done quite well in this regard. Within the last year or 2, she has developed issues with neuromuscular discomfort that may migrate around the body. The patient has had some low back pain, she has recently had MRI evaluation of the low back that reveals evidence of mild spinal stenosis at the L3-4 level and some neuroforaminal stenosis at the L4-5 level off of the left. The patient indicates that she is exercise intolerant, if she walks greater than 1 mile, she will have significant increase in pain in the back and legs. She feels as if her muscles may tighten up. She does have some underlying anxiety, she takes Ativan for this. The patient indicates that the discomfort may affect the neck and shoulders, upper arms, and the areas of pain varies from day-to-day. She stretches out frequently, she does yoga, she sees a Restaurant manager, fast food with some benefit. She also has physical therapy through [facility]. The patient does report some constipation issues, no problems controlling the bladder. She denies any balance issues or falls. She is not on any medications for the discomfort at this time. She is sent to this office for an evaluation.  Past Medical History  Diagnosis Date  . Migraine headache   . Anxiety disorder   . IBS (irritable bowel syndrome)   . Myalgia and myositis, unspecified   . Glaucoma   . Low iron     Past Surgical History  Procedure Laterality Date  . Tonsillectomy    . Mohs surgery      Squamous cell    Family History  Problem Relation Age of Onset  . Heart attack Mother   .  Cancer Mother   . Heart attack Father   . Gout Father   . Anxiety disorder Father   . Heart disease Brother   . Headache Maternal Grandmother   . Heart disease Maternal Grandmother     Social history:  reports that she has never smoked. She has never used smokeless tobacco. She reports that she drinks alcohol. She reports that she does not use illicit drugs.  Medications:  Prior to Admission medications   Medication Sig Start Date End Date Taking? Authorizing Provider  brimonidine (ALPHAGAN P) 0.1 % SOLN Place 1 drop into both eyes 2 (two) times daily.   Yes Historical Provider, MD  cetirizine (ZYRTEC) 10 MG tablet Take 10 mg by mouth daily as needed.    Yes Historical Provider, MD  desonide (DESOWEN) 0.05 % ointment Apply 1 application topically 2 (two) times daily.   Yes Historical Provider, MD  FERREX 150 150 MG capsule Take 150 mg by mouth daily. 03/21/15  Yes Historical Provider, MD  fluocinonide ointment (LIDEX) [id number] % Apply 1 application topically at bedtime as needed.   Yes Historical Provider, MD  HYDROcodone-acetaminophen (NORCO) 10-325 MG per tablet Take 1 tablet by mouth every 6 (six) hours as needed for pain.   Yes Historical Provider, MD  LORazepam (ATIVAN) 1 MG tablet Take 0.5 mg by mouth every 8 (eight) hours as needed. 1 1/2 tablet daily 07/03/12  Yes Historical Provider, MD  tiZANidine (ZANAFLEX) 2 MG tablet Take  0.5 tablets (1 mg total) by mouth every 8 (eight) hours as needed. 01/24/14  Yes Philmore Pali, NP  XOPENEX HFA 45 MCG/ACT inhaler Inhale into the lungs as needed. 05/09/13  Yes Historical Provider, MD      Allergies  Allergen Reactions  . Penicillins   . Pamelor [Nortriptyline Hcl]   . Topamax [Topiramate]     ROS:  Out of a complete 14 system review of symptoms, the patient complains only of the following symptoms, and all other reviewed systems are negative.  Neuromuscular discomfort Headache  Blood pressure 115/75, pulse 53, height 5\' 4"  (1.626 m),  weight 126 lb 8 oz (57.38 kg).  Physical Exam  General: The patient is alert and cooperative at the time of the examination.  Eyes: Pupils are equal, round, and reactive to light. Discs are flat bilaterally.  Neck: The neck is supple, no carotid bruits are noted.  Respiratory: The respiratory examination is clear.  Cardiovascular: The cardiovascular examination reveals a regular rate and rhythm, no obvious murmurs or rubs are noted.  Neuromuscular: Range of movement of the cervical spine and lumbar spine are full.  Skin: Extremities are without significant edema.  Neurologic Exam  Mental status: The patient is alert and oriented x 3 at the time of the examination. The patient has apparent normal recent and remote memory, with an apparently normal attention span and concentration ability.  Cranial nerves: Facial symmetry is present. There is good sensation of the face to pinprick and soft touch bilaterally. The strength of the facial muscles and the muscles to head turning and shoulder shrug are normal bilaterally. Speech is well enunciated, no aphasia or dysarthria is noted. Extraocular movements are full. Visual fields are full. The tongue is midline, and the patient has symmetric elevation of the soft palate. No obvious hearing deficits are noted.  Motor: The motor testing reveals 5 over 5 strength of all 4 extremities. Good symmetric motor tone is noted throughout.  Sensory: Sensory testing is intact to pinprick, soft touch, vibration sensation, and position sense on all 4 extremities. No stocking pattern pinprick sensory deficit was noted in the legs. No evidence of extinction is noted.  Coordination: Cerebellar testing reveals good finger-nose-finger and heel-to-shin bilaterally.  Gait and station: Gait is normal. Tandem gait is normal. Romberg is negative. No drift is seen.  Reflexes: Deep tendon reflexes are symmetric and normal bilaterally. Toes are downgoing  bilaterally.   MRI lumbar 07/11/14:   IMPRESSION: 1. No acute findings in the lumbar spine. Stable disc and facet degeneration with up to mild spinal stenosis at L3-L4, and moderate left foraminal stenosis at L4-L5. 2. Small lower thoracic disc protrusion at T11-T12 may have increased since 2011, but does not appear to result in spinal stenosis.   Assessment/Plan:  1. Neuromuscular discomfort, fibromyalgia  2. Migraine headache, improved  The patient is doing well with her migraine headaches, but she has developed increased discomfort associated with a migratory neuromuscular pain syndrome consistent with fibromyalgia. In the past, the patient has been very sensitive to various medications. We will start low-dose gabapentin at this time, and follow-up in about 6 months. The patient will contact our office if she requires a dosage adjustment of the medication.  Jill Alexanders MD 04/22/2015 6:48 PM  [facility] [address] [place], [place] [address]  Phone [phone] Fax [phone]

## 2015-04-23 ENCOUNTER — Telehealth: Payer: Self-pay | Admitting: Neurology

## 2015-04-23 NOTE — Telephone Encounter (Signed)
Patient called to request that visit notes from yesterday's visit be sent to PCP Dr. Inda Merlin.

## 2015-04-23 NOTE — Telephone Encounter (Signed)
I called the patient and advised that Dr. Jannifer Franklin would send the notes to Dr. Inda Merlin, since Dr. Inda Merlin was the referring provider.

## 2015-06-11 ENCOUNTER — Other Ambulatory Visit: Payer: Self-pay | Admitting: *Deleted

## 2015-06-11 MED ORDER — GABAPENTIN 100 MG PO CAPS: 100.0000 mg | ORAL_CAPSULE | Freq: Three times a day (TID) | ORAL | Status: DC

## 2015-06-11 NOTE — Telephone Encounter (Signed)
Request for 90 day supply.  Last seen 04-2015.  Gabapentin 100mg  po tid. #270 refill x 1.

## 2015-06-17 DIAGNOSIS — M545 Low back pain: Secondary | ICD-10-CM | POA: Diagnosis not present

## 2015-07-13 DIAGNOSIS — D125 Benign neoplasm of sigmoid colon: Secondary | ICD-10-CM | POA: Diagnosis not present

## 2015-07-13 DIAGNOSIS — D122 Benign neoplasm of ascending colon: Secondary | ICD-10-CM | POA: Diagnosis not present

## 2015-07-13 DIAGNOSIS — Z1211 Encounter for screening for malignant neoplasm of colon: Secondary | ICD-10-CM | POA: Diagnosis not present

## 2015-07-13 DIAGNOSIS — K635 Polyp of colon: Secondary | ICD-10-CM | POA: Diagnosis not present

## 2015-08-04 DIAGNOSIS — Z131 Encounter for screening for diabetes mellitus: Secondary | ICD-10-CM | POA: Diagnosis not present

## 2015-08-04 DIAGNOSIS — Z130 Encounter for screening for diseases of the blood and blood-forming organs and certain disorders involving the immune mechanism: Secondary | ICD-10-CM | POA: Diagnosis not present

## 2015-08-04 DIAGNOSIS — Z01419 Encounter for gynecological examination (general) (routine) without abnormal findings: Secondary | ICD-10-CM | POA: Diagnosis not present

## 2015-08-04 DIAGNOSIS — Z13228 Encounter for screening for other metabolic disorders: Secondary | ICD-10-CM | POA: Diagnosis not present

## 2015-08-04 DIAGNOSIS — Z6822 Body mass index (BMI) 22.0-22.9, adult: Secondary | ICD-10-CM | POA: Diagnosis not present

## 2015-08-06 ENCOUNTER — Telehealth: Payer: Self-pay | Admitting: *Deleted

## 2015-08-06 NOTE — Telephone Encounter (Signed)
Message For: OFFICE               Taken  1-JUN-17 at  1:31PM by CEF ------------------------------------------------------------ Brenda Smith           CID [id number]  Patient SAME                 Pt's Dr Jannifer Franklin       Area Code 336 Phone# [phone] * DOB [birth year] [place] SOMEONE ABOUT MEDICATION      SHE WAS PRESCRIBED                                   Disp:Y/N N If Y = C/B If No Response In 78minutes ============================================================

## 2015-08-07 NOTE — Telephone Encounter (Signed)
Returned pt TC. Left VM mssg to call back to discuss further.

## 2015-08-17 NOTE — Telephone Encounter (Signed)
This is Dr. Jannifer Franklin' patient.  Brenda Smith states that on 04/25/15 she started taking Gabapentin 100mg  TID for burning sensation in hands, feet, and top of the head. She also had a little muscle aching at this time. Patient states the medication helped the burning sensation but noticed an increase in muscle aches. She feels that she can do less than before. Unable to do Yoga due to pain, and had to stop a massage half way through due to soreness and muscle cramping. Patient reports that the medication seems to have increased her anxiety and is sleeping less. At the beginning of June, she cut down the dosage to once a day but is still having the same problems. Any recommendations?

## 2015-08-17 NOTE — Telephone Encounter (Signed)
I spoke to patient and she asked to see Dr. Jannifer Franklin at this point. I was able to get her in with him on Wednesday. She has been advised that she can se NP if needed sooner.

## 2015-08-17 NOTE — Telephone Encounter (Signed)
Patient called back to find out approximately when nurse would be calling back, doesn't want to miss call. Patient advised, nurse and Doctor are seeing patient's and they return phone calls when they are not with a patient. Patient understands.

## 2015-08-17 NOTE — Telephone Encounter (Addendum)
Patient returned Jennifer's call, states GABAPENTIN is not working and she is in a lot of pain. Please call [phone].

## 2015-08-17 NOTE — Telephone Encounter (Signed)
These issues up as discussed during her follow-up appointment, we can see if she can be scheduled with one of our nurse practitioners or help her move up the appointment with Dr. Jannifer Franklin, her current appointment is in August. pls help with scheduling.

## 2015-08-19 ENCOUNTER — Encounter: Payer: Self-pay | Admitting: Adult Health

## 2015-08-19 ENCOUNTER — Ambulatory Visit (INDEPENDENT_AMBULATORY_CARE_PROVIDER_SITE_OTHER): Payer: BLUE CROSS/BLUE SHIELD | Admitting: Adult Health

## 2015-08-19 VITALS — BP 100/66 | HR 56 | Resp 14 | Ht 64.0 in | Wt 126.4 lb

## 2015-08-19 DIAGNOSIS — M79609 Pain in unspecified limb: Secondary | ICD-10-CM | POA: Diagnosis not present

## 2015-08-19 DIAGNOSIS — IMO0001 Reserved for inherently not codable concepts without codable children: Secondary | ICD-10-CM

## 2015-08-19 DIAGNOSIS — M609 Myositis, unspecified: Secondary | ICD-10-CM

## 2015-08-19 DIAGNOSIS — M791 Myalgia: Secondary | ICD-10-CM | POA: Diagnosis not present

## 2015-08-19 MED ORDER — DULOXETINE HCL 20 MG PO CPEP: 20.0000 mg | ORAL_CAPSULE | Freq: Every day | ORAL | Status: DC

## 2015-08-19 NOTE — Progress Notes (Signed)
I agree with the assessment and plan as directed by NP .   Bretton Tandy, MD  

## 2015-08-19 NOTE — Patient Instructions (Signed)
Stop gabapentin Start Cymbalta 20 mg daily If your symptoms worsen or you develop new symptoms please let us know.   Duloxetine delayed-release capsules What is this medicine? DULOXETINE (doo LOX e teen) is used to treat depression, anxiety, and different types of chronic pain. This medicine may be used for other purposes; ask your health care provider or pharmacist if you have questions. What should I tell my health care provider before I take this medicine? They need to know if you have any of these conditions: -bipolar disorder or a family history of bipolar disorder -glaucoma -kidney disease -liver disease -suicidal thoughts or a previous suicide attempt -taken medicines called MAOIs like Carbex, Eldepryl, Marplan, Nardil, and Parnate within 14 days -an unusual reaction to duloxetine, other medicines, foods, dyes, or preservatives -pregnant or trying to get pregnant -breast-feeding How should I use this medicine? Take this medicine by mouth with a glass of water. Follow the directions on the prescription label. Do not cut, crush or chew this medicine. You can take this medicine with or without food. Take your medicine at regular intervals. Do not take your medicine more often than directed. Do not stop taking this medicine suddenly except upon the advice of your doctor. Stopping this medicine too quickly may cause serious side effects or your condition may worsen. A special MedGuide will be given to you by the pharmacist with each prescription and refill. Be sure to read this information carefully each time. Talk to your pediatrician regarding the use of this medicine in children. While this drug may be prescribed for children as young as 66 years of age for selected conditions, precautions do apply. Overdosage: If you think you have taken too much of this medicine contact a poison control center or emergency room at once. NOTE: This medicine is only for you. Do not share this medicine  with others. What if I miss a dose? If you miss a dose, take it as soon as you can. If it is almost time for your next dose, take only that dose. Do not take double or extra doses. What may interact with this medicine? Do not take this medicine with any of the following medications: -certain diet drugs like dexfenfluramine, fenfluramine -desvenlafaxine -linezolid -MAOIs like Azilect, Carbex, Eldepryl, Marplan, Nardil, and Parnate -methylene blue (intravenous) -milnacipran -thioridazine -venlafaxine This medicine may also interact with the following medications: -alcohol -aspirin and aspirin-like medicines -certain antibiotics like ciprofloxacin and enoxacin -certain medicines for blood pressure, heart disease, irregular heart beat -certain medicines for depression, anxiety, or psychotic disturbances -certain medicines for migraine headache like almotriptan, eletriptan, frovatriptan, naratriptan, rizatriptan, sumatriptan, zolmitriptan -certain medicines that treat or prevent blood clots like warfarin, enoxaparin, and dalteparin -cimetidine -fentanyl -lithium -NSAIDS, medicines for pain and inflammation, like ibuprofen or naproxen -phentermine -procarbazine -sibutramine -St. John's wort -theophylline -tramadol -tryptophan This list may not describe all possible interactions. Give your health care provider a list of all the medicines, herbs, non-prescription drugs, or dietary supplements you use. Also tell them if you smoke, drink alcohol, or use illegal drugs. Some items may interact with your medicine. What should I watch for while using this medicine? Tell your doctor if your symptoms do not get better or if they get worse. Visit your doctor or health care professional for regular checks on your progress. Because it may take several weeks to see the full effects of this medicine, it is important to continue your treatment as prescribed by your doctor. Patients and their families  should  watch out for new or worsening thoughts of suicide or depression. Also watch out for sudden changes in feelings such as feeling anxious, agitated, panicky, irritable, hostile, aggressive, impulsive, severely restless, overly excited and hyperactive, or not being able to sleep. If this happens, especially at the beginning of treatment or after a change in dose, call your health care professional. Dennis Bast may get drowsy or dizzy. Do not drive, use machinery, or do anything that needs mental alertness until you know how this medicine affects you. Do not stand or sit up quickly, especially if you are an older patient. This reduces the risk of dizzy or fainting spells. Alcohol may interfere with the effect of this medicine. Avoid alcoholic drinks. This medicine can cause an increase in blood pressure. This medicine can also cause a sudden drop in your blood pressure, which may make you feel faint and increase the chance of a fall. These effects are most common when you first start the medicine or when the dose is increased, or during use of other medicines that can cause a sudden drop in blood pressure. Check with your doctor for instructions on monitoring your blood pressure while taking this medicine. Your mouth may get dry. Chewing sugarless gum or sucking hard candy, and drinking plenty of water may help. Contact your doctor if the problem does not go away or is severe. What side effects may I notice from receiving this medicine? Side effects that you should report to your doctor or health care professional as soon as possible: -allergic reactions like skin rash, itching or hives, swelling of the face, lips, or tongue -changes in blood pressure -confusion -dark urine -dizziness -fast talking and excited feelings or actions that are out of control -fast, irregular heartbeat -fever -general ill feeling or flu-like symptoms -hallucination, loss of contact with reality -light-colored stools -loss of  balance or coordination -redness, blistering, peeling or loosening of the skin, including inside the mouth -right upper belly pain -seizures -suicidal thoughts or other mood changes -trouble concentrating -trouble passing urine or change in the amount of urine -unusual bleeding or bruising -unusually weak or tired -yellowing of the eyes or skin Side effects that usually do not require medical attention (report to your doctor or health care professional if they continue or are bothersome): -blurred vision -change in appetite -change in sex drive or performance -headache -increased sweating -nausea This list may not describe all possible side effects. Call your doctor for medical advice about side effects. You may report side effects to FDA at 1-800-FDA-1088. Where should I keep my medicine? Keep out of the reach of children. Store at room temperature between 20 and 25 degrees C (68 to 77 degrees F). Throw away any unused medicine after the expiration date. NOTE: This sheet is a summary. It may not cover all possible information. If you have questions about this medicine, talk to your doctor, pharmacist, or health care provider.    2016, Elsevier/Gold Standard. (2013-02-12 [id number])

## 2015-08-19 NOTE — Progress Notes (Signed)
PATIENT: Brenda Smith DOB: Feb 25, 1951  REASON FOR VISIT: follow up- neuromuscular discomfort HISTORY FROM: patient  HISTORY OF PRESENT ILLNESS: Brenda Smith is a 65 year old female with a history of neuromuscular discomfort consistent with fibromyalgia. She was placed on gabapentin at her last visit. She reports that this improved the pain in the palm of her hands and top of the head. She states she developed pain in other parts of the body. She states that walking for long periods of time causes muscle pain and cramps. She states in the past she tried getting a massage and it was uncomfortable. She does participate in yoga and drinks a lot of water. She states that her discomfort is migratory. She reports discomfort throughout the body at various times of the day. She also has discomfort in the muscles specifically in the legs. She returns today for an evaluation.  HISTORY 04/22/15 (WILLIS): Brenda Smith is a 65 year old right-handed white female with a history of migraine headaches. The patient was last seen through this office in November 2015. The patient indicates that her migraine headaches have become very irregular. She has done quite well in this regard. Within the last year or 2, she has developed issues with neuromuscular discomfort that may migrate around the body. The patient has had some low back pain, she has recently had MRI evaluation of the low back that reveals evidence of mild spinal stenosis at the L3-4 level and some neuroforaminal stenosis at the L4-5 level off of the left. The patient indicates that she is exercise intolerant, if she walks greater than 1 mile, she will have significant increase in pain in the back and legs. She feels as if her muscles may tighten up. She does have some underlying anxiety, she takes Ativan for this. The patient indicates that the discomfort may affect the neck and shoulders, upper arms, and the areas of pain varies from day-to-day. She  stretches out frequently, she does yoga, she sees a Restaurant manager, fast food with some benefit. She also has physical therapy through [facility]. The patient does report some constipation issues, no problems controlling the bladder. She denies any balance issues or falls. She is not on any medications for the discomfort at this time. She is sent to this office for an evaluation.  REVIEW OF SYSTEMS: Out of a complete 14 system review of symptoms, the patient complains only of the following symptoms, and all other reviewed systems are negative.  Shortness of breath, leg swelling, ringing in ears, heat intolerance, abdominal pain, diarrhea, nausea, back pain, aching muscles, muscle cramps, neck stiffness, environmental allergies, nervous/anxious  ALLERGIES: Allergies  Allergen Reactions  . Penicillins   . Pamelor [Nortriptyline Hcl]   . Topamax [Topiramate]     HOME MEDICATIONS: Outpatient Prescriptions Prior to Visit  Medication Sig Dispense Refill  . brimonidine (ALPHAGAN P) 0.1 % SOLN Place 1 drop into both eyes 2 (two) times daily.    . fluocinonide ointment (LIDEX) [id number] % Apply 1 application topically at bedtime as needed.    Marland Kitchen HYDROcodone-acetaminophen (NORCO) 10-325 MG per tablet Take 1 tablet by mouth every 6 (six) hours as needed for pain.    Marland Kitchen LORazepam (ATIVAN) 1 MG tablet Take 0.5 mg by mouth every 8 (eight) hours as needed. 1 1/2 tablet daily    . tiZANidine (ZANAFLEX) 2 MG tablet Take 0.5 tablets (1 mg total) by mouth every 8 (eight) hours as needed. 45 tablet 5  . XOPENEX HFA 45 MCG/ACT inhaler Inhale into the lungs  as needed.    . cetirizine (ZYRTEC) 10 MG tablet Take 10 mg by mouth daily as needed. Reported on 08/19/2015    . desonide (DESOWEN) 0.05 % ointment Apply 1 application topically 2 (two) times daily. Reported on 08/19/2015    . FERREX 150 150 MG capsule Take 150 mg by mouth daily. Reported on 08/19/2015  2  . gabapentin (NEURONTIN) 100 MG capsule Take 1 capsule (100 mg  total) by mouth 3 (three) times daily. (Patient not taking: Reported on 08/19/2015) 270 capsule 1   No facility-administered medications prior to visit.    PAST MEDICAL HISTORY: Past Medical History  Diagnosis Date  . Migraine headache   . Anxiety disorder   . IBS (irritable bowel syndrome)   . Myalgia and myositis, unspecified   . Glaucoma   . Low iron     PAST SURGICAL HISTORY: Past Surgical History  Procedure Laterality Date  . Tonsillectomy    . Mohs surgery      Squamous cell    FAMILY HISTORY: Family History  Problem Relation Age of Onset  . Heart attack Mother   . Cancer Mother   . Heart attack Father   . Gout Father   . Anxiety disorder Father   . Heart disease Brother   . Headache Maternal Grandmother   . Heart disease Maternal Grandmother     SOCIAL HISTORY: Social History   Social History  . Marital Status: Married    Spouse Name: N/A  . Number of Children: 0  . Years of Education: PHD   Occupational History  . Not on file.   Social History Main Topics  . Smoking status: Never Smoker   . Smokeless tobacco: Never Used  . Alcohol Use: Yes     Comment: Drinks wine on occasion  . Drug Use: No  . Sexual Activity: No   Other Topics Concern  . Not on file   Social History Narrative   Patient is married, no children.   Patient is right handed.   Patient has her PhD.   Patient drinks decaffeinated tea.      PHYSICAL EXAM  Filed Vitals:   08/19/15 1020  BP: 100/66  Pulse: 56  Resp: 14  Height: 5\' 4"  (1.626 m)  Weight: 126 lb 6.4 oz (57.335 kg)   Body mass index is 21.69 kg/(m^2).  Generalized: Well developed, in no acute distress   Neurological examination  Mentation: Alert oriented to time, place, history taking. Follows all commands speech and language fluent Cranial nerve II-XII: Pupils were equal round reactive to light. Extraocular movements were full, visual field were full on confrontational test. Facial sensation and  strength were normal. Uvula tongue midline. Head turning and shoulder shrug  were normal and symmetric. Motor: The motor testing reveals 5 over 5 strength of all 4 extremities. Good symmetric motor tone is noted throughout.  Sensory: Sensory testing is intact to soft touch on all 4 extremities. No evidence of extinction is noted.  Coordination: Cerebellar testing reveals good finger-nose-finger and heel-to-shin bilaterally.  Gait and station: Gait is normal. Tandem gait is normal. Romberg is negative. No drift is seen.  Reflexes: Deep tendon reflexes are symmetric and normal bilaterally.   DIAGNOSTIC DATA (LABS, IMAGING, TESTING) - I reviewed patient records, labs, notes, testing and imaging myself where available.     ASSESSMENT AND PLAN 65 y.o. year old female  has a past medical history of Migraine headache; Anxiety disorder; IBS (irritable bowel syndrome); Myalgia and myositis,  unspecified; Glaucoma; and Low iron. here with:  1. Neuromuscular discomfort  The patient's exam is relatively unremarkable. She was unable to tolerate gabapentin. She will stop this medication. I will start the patient on Cymbalta 20 mg at bedtime. I have reviewed the side effects with the patient. I also provided her with a handout. Patient advised that she is unable to tolerate this medication she should let me know. She will follow-up in 4 months or sooner if needed.    Ward Givens, MSN, NP-C 08/19/2015, 10:37 AM [facility] [address], [place], [place] [address] [phone]

## 2015-08-26 ENCOUNTER — Ambulatory Visit: Payer: Self-pay | Admitting: Neurology

## 2015-10-15 ENCOUNTER — Ambulatory Visit: Payer: BLUE CROSS/BLUE SHIELD | Admitting: Neurology

## 2015-10-22 DIAGNOSIS — Z85828 Personal history of other malignant neoplasm of skin: Secondary | ICD-10-CM | POA: Diagnosis not present

## 2015-10-22 DIAGNOSIS — D2230 Melanocytic nevi of unspecified part of face: Secondary | ICD-10-CM | POA: Diagnosis not present

## 2015-10-22 DIAGNOSIS — D225 Melanocytic nevi of trunk: Secondary | ICD-10-CM | POA: Diagnosis not present

## 2015-11-12 DIAGNOSIS — H401230 Low-tension glaucoma, bilateral, stage unspecified: Secondary | ICD-10-CM | POA: Diagnosis not present

## 2015-11-26 DIAGNOSIS — Z0000 Encounter for general adult medical examination without abnormal findings: Secondary | ICD-10-CM | POA: Diagnosis not present

## 2015-11-26 DIAGNOSIS — F419 Anxiety disorder, unspecified: Secondary | ICD-10-CM | POA: Diagnosis not present

## 2015-11-26 DIAGNOSIS — Z23 Encounter for immunization: Secondary | ICD-10-CM | POA: Diagnosis not present

## 2015-11-26 DIAGNOSIS — J4520 Mild intermittent asthma, uncomplicated: Secondary | ICD-10-CM | POA: Diagnosis not present

## 2015-12-08 DIAGNOSIS — H6691 Otitis media, unspecified, right ear: Secondary | ICD-10-CM | POA: Diagnosis not present

## 2015-12-08 DIAGNOSIS — J069 Acute upper respiratory infection, unspecified: Secondary | ICD-10-CM | POA: Diagnosis not present

## 2015-12-21 DIAGNOSIS — H6990 Unspecified Eustachian tube disorder, unspecified ear: Secondary | ICD-10-CM | POA: Diagnosis not present

## 2015-12-21 DIAGNOSIS — H938X9 Other specified disorders of ear, unspecified ear: Secondary | ICD-10-CM | POA: Diagnosis not present

## 2015-12-22 ENCOUNTER — Ambulatory Visit: Payer: BLUE CROSS/BLUE SHIELD | Admitting: Adult Health

## 2015-12-28 DIAGNOSIS — Z803 Family history of malignant neoplasm of breast: Secondary | ICD-10-CM | POA: Diagnosis not present

## 2015-12-28 DIAGNOSIS — Z1231 Encounter for screening mammogram for malignant neoplasm of breast: Secondary | ICD-10-CM | POA: Diagnosis not present

## 2015-12-31 DIAGNOSIS — H6521 Chronic serous otitis media, right ear: Secondary | ICD-10-CM | POA: Insufficient documentation

## 2015-12-31 DIAGNOSIS — H90A31 Mixed conductive and sensorineural hearing loss, unilateral, right ear with restricted hearing on the contralateral side: Secondary | ICD-10-CM | POA: Insufficient documentation

## 2015-12-31 DIAGNOSIS — H90A22 Sensorineural hearing loss, unilateral, left ear, with restricted hearing on the contralateral side: Secondary | ICD-10-CM | POA: Diagnosis not present

## 2016-02-15 ENCOUNTER — Ambulatory Visit (INDEPENDENT_AMBULATORY_CARE_PROVIDER_SITE_OTHER): Payer: BLUE CROSS/BLUE SHIELD | Admitting: Adult Health

## 2016-02-15 ENCOUNTER — Encounter: Payer: Self-pay | Admitting: Adult Health

## 2016-02-15 VITALS — BP 97/65 | HR 56 | Resp 14 | Ht 64.0 in | Wt 130.0 lb

## 2016-02-15 DIAGNOSIS — M797 Fibromyalgia: Secondary | ICD-10-CM

## 2016-02-15 NOTE — Progress Notes (Signed)
I have read the note, and I agree with the clinical assessment and plan.  Sloan Galentine KEITH   

## 2016-02-15 NOTE — Patient Instructions (Signed)
Consider amitriptyline- ( Nortriptyline is another medication in the same class that you may have tried)  Amitriptyline tablets What is this medicine? AMITRIPTYLINE (a mee TRIP ti leen) is used to treat depression. This medicine may be used for other purposes; ask your health care provider or pharmacist if you have questions. COMMON BRAND NAME(S): Elavil, Vanatrip What should I tell my health care provider before I take this medicine? They need to know if you have any of these conditions: -an alcohol problem -asthma, difficulty breathing -bipolar disorder or schizophrenia -difficulty passing urine, prostate trouble -glaucoma -heart disease or previous heart attack -liver disease -over active thyroid -seizures -thoughts or plans of suicide, a previous suicide attempt, or family history of suicide attempt -an unusual or allergic reaction to amitriptyline, other medicines, foods, dyes, or preservatives -pregnant or trying to get pregnant -breast-feeding How should I use this medicine? Take this medicine by mouth with a drink of water. Follow the directions on the prescription label. You can take the tablets with or without food. Take your medicine at regular intervals. Do not take it more often than directed. Do not stop taking this medicine suddenly except upon the advice of your doctor. Stopping this medicine too quickly may cause serious side effects or your condition may worsen. A special MedGuide will be given to you by the pharmacist with each prescription and refill. Be sure to read this information carefully each time. Talk to your pediatrician regarding the use of this medicine in children. Special care may be needed. Overdosage: If you think you have taken too much of this medicine contact a poison control center or emergency room at once. NOTE: This medicine is only for you. Do not share this medicine with others. What if I miss a dose? If you miss a dose, take it as soon as you  can. If it is almost time for your next dose, take only that dose. Do not take double or extra doses. What may interact with this medicine? Do not take this medicine with any of the following medications: -arsenic trioxide -certain medicines used to regulate abnormal heartbeat or to treat other heart conditions -cisapride -droperidol -halofantrine -linezolid -MAOIs like Carbex, Eldepryl, Marplan, Nardil, and Parnate -methylene blue -other medicines for mental depression -phenothiazines like perphenazine, thioridazine and chlorpromazine -pimozide -probucol -procarbazine -sparfloxacin -St. John's Wort -ziprasidone This medicine may also interact with the following medications: -atropine and related drugs like hyoscyamine, scopolamine, tolterodine and others -barbiturate medicines for inducing sleep or treating seizures, like phenobarbital -cimetidine -disulfiram -ethchlorvynol -thyroid hormones such as levothyroxine This list may not describe all possible interactions. Give your health care provider a list of all the medicines, herbs, non-prescription drugs, or dietary supplements you use. Also tell them if you smoke, drink alcohol, or use illegal drugs. Some items may interact with your medicine. What should I watch for while using this medicine? Tell your doctor if your symptoms do not get better or if they get worse. Visit your doctor or health care professional for regular checks on your progress. Because it may take several weeks to see the full effects of this medicine, it is important to continue your treatment as prescribed by your doctor. Patients and their families should watch out for new or worsening thoughts of suicide or depression. Also watch out for sudden changes in feelings such as feeling anxious, agitated, panicky, irritable, hostile, aggressive, impulsive, severely restless, overly excited and hyperactive, or not being able to sleep. If this happens, especially at the  beginning of treatment or after a change in dose, call your health care professional. Dennis Bast may get drowsy or dizzy. Do not drive, use machinery, or do anything that needs mental alertness until you know how this medicine affects you. Do not stand or sit up quickly, especially if you are an older patient. This reduces the risk of dizzy or fainting spells. Alcohol may interfere with the effect of this medicine. Avoid alcoholic drinks. Do not treat yourself for coughs, colds, or allergies without asking your doctor or health care professional for advice. Some ingredients can increase possible side effects. Your mouth may get dry. Chewing sugarless gum or sucking hard candy, and drinking plenty of water will help. Contact your doctor if the problem does not go away or is severe. This medicine may cause dry eyes and blurred vision. If you wear contact lenses you may feel some discomfort. Lubricating drops may help. See your eye doctor if the problem does not go away or is severe. This medicine can cause constipation. Try to have a bowel movement at least every 2 to 3 days. If you do not have a bowel movement for 3 days, call your doctor or health care professional. This medicine can make you more sensitive to the sun. Keep out of the sun. If you cannot avoid being in the sun, wear protective clothing and use sunscreen. Do not use sun lamps or tanning beds/booths. What side effects may I notice from receiving this medicine? Side effects that you should report to your doctor or health care professional as soon as possible: -allergic reactions like skin rash, itching or hives, swelling of the face, lips, or tongue -anxious -breathing problems -changes in vision -confusion -elevated mood, decreased need for sleep, racing thoughts, impulsive behavior -eye pain -fast, irregular heartbeat -feeling faint or lightheaded, falls -feeling agitated, angry, or irritable -fever with increased sweating -hallucination,  loss of contact with reality -seizures -stiff muscles -suicidal thoughts or other mood changes -tingling, pain, or numbness in the feet or hands -trouble passing urine or change in the amount of urine -trouble sleeping -unusually weak or tired -vomiting -yellowing of the eyes or skin Side effects that usually do not require medical attention (report to your doctor or health care professional if they continue or are bothersome): -change in sex drive or performance -change in appetite or weight -constipation -dizziness -dry mouth -nausea -tired -tremors -upset stomach This list may not describe all possible side effects. Call your doctor for medical advice about side effects. You may report side effects to FDA at 1-800-FDA-1088. Where should I keep my medicine? Keep out of the reach of children. Store at room temperature between 20 and 25 degrees C (68 and 77 degrees F). Throw away any unused medicine after the expiration date. NOTE: This sheet is a summary. It may not cover all possible information. If you have questions about this medicine, talk to your doctor, pharmacist, or health care provider.  2017 Elsevier/Gold Standard (2015-07-24 12:14:15)

## 2016-02-15 NOTE — Progress Notes (Signed)
PATIENT: Brenda Smith DOB: 01-10-1951  REASON FOR VISIT: follow up- fibromyalgia HISTORY FROM: patient  HISTORY OF PRESENT ILLNESS: Ms. Brenda Smith is a 65 year female with a history of neuromuscular discomfort consistent with fibromyalgia. She returns today for follow-up. In the past the patient has tried several medications but has been unable to tolerate them. She tried gabapentin, Topamax and Cymbalta. It appears according her allergy list that she is also tried nortriptyline although the patient does not remember this medication. She states that Cymbalta made her "feel bad." She states that she has dizziness and lightheadedness as well as fluctuations in her heart rate while on this medication. She states that she continues to have muscle pain. She states that she will have nerve pain in the legs and then it may move into the shoulder blades. She states that she sometimes can go 1-2 days without any pain. She states that her discomfort is erratic with no pattern. She states that she does do things such as yoga daily, exercise, massage and acupuncture. She states that she also has noticed that she's been losing her hair unsure what to contribute this to. She also reports tinnitus for 8 years. She returns today for an evaluation.  HISTORY 08/19/15: Ms. Brenda Smith is a 65 year old female with a history of neuromuscular discomfort consistent with fibromyalgia. She was placed on gabapentin at her last visit. She reports that this improved the pain in the palm of her hands and top of the head. She states she developed pain in other parts of the body. She states that walking for long periods of time causes muscle pain and cramps. She states in the past she tried getting a massage and it was uncomfortable. She does participate in yoga and drinks a lot of water. She states that her discomfort is migratory. She reports discomfort throughout the body at various times of the day. She also has discomfort in  the muscles specifically in the legs. She returns today for an evaluation.  HISTORY 04/22/15 (Brenda Smith): Ms. Brenda Smith is a 65 year old right-handed white female with a history of migraine headaches. The patient was last seen through this office in November 2015. The patient indicates that her migraine headaches have become very irregular. She has done quite well in this regard. Within the last year or 2, she has developed issues with neuromuscular discomfort that may migrate around the body. The patient has had some low back pain, she has recently had MRI evaluation of the low back that reveals evidence of mild spinal stenosis at the L3-4 level and some neuroforaminal stenosis at the L4-5 level off of the left. The patient indicates that she is exercise intolerant, if she walks greater than 1 mile, she will have significant increase in pain in the back and legs. She feels as if her muscles may tighten up. She does have some underlying anxiety, she takes Ativan for this. The patient indicates that the discomfort may affect the neck and shoulders, upper arms, and the areas of pain varies from day-to-day. She stretches out frequently, she does yoga, she sees a Restaurant manager, fast food with some benefit. She also has physical therapy through [facility]. The patient does report some constipation issues, no problems controlling the bladder. She denies any balance issues or falls. She is not on any medications for the discomfort at this time. She is sent to this office for an evaluation.   REVIEW OF SYSTEMS: Out of a complete 14 system review of symptoms, the patient complains only  of the following symptoms, and all other reviewed systems are negative.  Excessive thirst, environmental allergies, activity change, chills, ringing in ears, runny nose, cough, leg swelling, restless leg, frequent waking, back pain, aching muscles, muscle cramps, neck pain, neck stiffness, nervous/anxious  ALLERGIES: Allergies    Allergen Reactions  . Penicillins   . Pamelor [Nortriptyline Hcl]   . Topamax [Topiramate]     HOME MEDICATIONS: Outpatient Medications Prior to Visit  Medication Sig Dispense Refill  . brimonidine (ALPHAGAN P) 0.1 % SOLN Place 1 drop into both eyes 2 (two) times daily.    Marland Kitchen desonide (DESOWEN) 0.05 % ointment Apply 1 application topically 2 (two) times daily. Reported on 08/19/2015    . HYDROcodone-acetaminophen (NORCO) 10-325 MG per tablet Take 1 tablet by mouth every 6 (six) hours as needed for pain.    Marland Kitchen LORazepam (ATIVAN) 1 MG tablet Take 0.5 mg by mouth as needed. 1 1/2 tablet daily    . tiZANidine (ZANAFLEX) 2 MG tablet Take 0.5 tablets (1 mg total) by mouth every 8 (eight) hours as needed. 45 tablet 5  . XOPENEX HFA 45 MCG/ACT inhaler Inhale into the lungs as needed.    . cetirizine (ZYRTEC) 10 MG tablet Take 10 mg by mouth daily as needed. Reported on 08/19/2015    . DULoxetine (CYMBALTA) 20 MG capsule Take 1 capsule (20 mg total) by mouth daily. 30 capsule 3  . FERREX 150 150 MG capsule Take 150 mg by mouth daily. Reported on 08/19/2015  2  . fluocinonide ointment (LIDEX) [id number] % Apply 1 application topically at bedtime as needed.     No facility-administered medications prior to visit.     PAST MEDICAL HISTORY: Past Medical History:  Diagnosis Date  . Anxiety disorder   . Glaucoma   . IBS (irritable bowel syndrome)   . Low iron   . Migraine headache   . Myalgia and myositis, unspecified     PAST SURGICAL HISTORY: Past Surgical History:  Procedure Laterality Date  . MOHS SURGERY     Squamous cell  . TONSILLECTOMY      FAMILY HISTORY: Family History  Problem Relation Age of Onset  . Heart attack Mother   . Cancer Mother   . Heart attack Father   . Gout Father   . Anxiety disorder Father   . Heart disease Brother   . Headache Maternal Grandmother   . Heart disease Maternal Grandmother     SOCIAL HISTORY: Social History   Social History  . Marital  status: Married    Spouse name: N/A  . Number of children: 0  . Years of education: PHD   Occupational History  . Not on file.   Social History Main Topics  . Smoking status: Never Smoker  . Smokeless tobacco: Never Used  . Alcohol use Yes     Comment: Drinks wine on occasion  . Drug use: No  . Sexual activity: No   Other Topics Concern  . Not on file   Social History Narrative   Patient is married, no children.   Patient is right handed.   Patient has her PhD.   Patient drinks decaffeinated tea.      PHYSICAL EXAM  Vitals:   02/15/16 0729  BP: 97/65  Pulse: (!) 56  Resp: 14  Weight: 130 lb (59 kg)  Height: 5\' 4"  (1.626 m)   Body mass index is 22.31 kg/m.  Generalized: Well developed, in no acute distress   Neurological examination  Mentation: Alert oriented to time, place, history taking. Follows all commands speech and language fluent Cranial nerve II-XII: Pupils were equal round reactive to light. Extraocular movements were full, visual field were full on confrontational test. Facial sensation and strength were normal. Uvula tongue midline. Head turning and shoulder shrug  were normal and symmetric. Motor: The motor testing reveals 5 over 5 strength of all 4 extremities. Good symmetric motor tone is noted throughout.  Sensory: Sensory testing is intact to soft touch on all 4 extremities. No evidence of extinction is noted.  Coordination: Cerebellar testing reveals good finger-nose-finger and heel-to-shin bilaterally.  Gait and station: Gait is normal. Tandem gait is normal. Romberg is negative. No drift is seen.  Reflexes: Deep tendon reflexes are symmetric and normal bilaterally.   DIAGNOSTIC DATA (LABS, IMAGING, TESTING) - I reviewed patient records, labs, notes, testing and imaging myself where available.    ASSESSMENT AND PLAN 65 y.o. year old female  has a past medical history of Anxiety disorder; Glaucoma; IBS (irritable bowel syndrome); Low iron;  Migraine headache; and Myalgia and myositis, unspecified. here with:  1. Fibromyalgia  The patient continues to have neuromuscular discomfort. She is trying nonpharmacologic measures such as yoga, massage, exercise and acupuncture. I stated we could try amitriptyline 10 mg at bedtime however looks like she has an allergy to nortriptyline. The patient plans to check her records at home as she does not recall taking this medication. At this point I do feel that nonpharmacologic measures is going to be her best option as she is very sensitive to medication. In the future we can consider referral to pain clinic if needed. Patient is amenable to this plan. She will follow-up in 6 months with Dr. Jannifer Franklin.     Ward Givens, MSN, NP-C 02/15/2016, 7:38 AM [facility] [address], [place], [place] [address] [phone]

## 2016-02-22 DIAGNOSIS — R7303 Prediabetes: Secondary | ICD-10-CM | POA: Diagnosis not present

## 2016-02-22 DIAGNOSIS — M797 Fibromyalgia: Secondary | ICD-10-CM | POA: Diagnosis not present

## 2016-02-22 DIAGNOSIS — H6983 Other specified disorders of Eustachian tube, bilateral: Secondary | ICD-10-CM | POA: Diagnosis not present

## 2016-08-08 DIAGNOSIS — Z01419 Encounter for gynecological examination (general) (routine) without abnormal findings: Secondary | ICD-10-CM | POA: Diagnosis not present

## 2016-08-08 DIAGNOSIS — Z6823 Body mass index (BMI) 23.0-23.9, adult: Secondary | ICD-10-CM | POA: Diagnosis not present

## 2016-08-08 DIAGNOSIS — Z1212 Encounter for screening for malignant neoplasm of rectum: Secondary | ICD-10-CM | POA: Diagnosis not present

## 2016-08-08 DIAGNOSIS — Z13820 Encounter for screening for osteoporosis: Secondary | ICD-10-CM | POA: Diagnosis not present

## 2016-08-17 ENCOUNTER — Ambulatory Visit: Payer: BLUE CROSS/BLUE SHIELD | Admitting: Neurology

## 2016-08-17 ENCOUNTER — Telehealth: Payer: Self-pay | Admitting: *Deleted

## 2016-08-17 NOTE — Telephone Encounter (Signed)
Called and LVM. Advised I called since she was on wait list. Was going to offer appt.today. Since unable to reach, advised I will call if anything else opens in the future.

## 2016-08-24 DIAGNOSIS — M791 Myalgia: Secondary | ICD-10-CM | POA: Diagnosis not present

## 2016-08-24 DIAGNOSIS — M810 Age-related osteoporosis without current pathological fracture: Secondary | ICD-10-CM | POA: Diagnosis not present

## 2016-08-24 DIAGNOSIS — R7303 Prediabetes: Secondary | ICD-10-CM | POA: Diagnosis not present

## 2016-08-24 DIAGNOSIS — E7800 Pure hypercholesterolemia, unspecified: Secondary | ICD-10-CM | POA: Diagnosis not present

## 2016-09-14 ENCOUNTER — Encounter: Payer: Self-pay | Admitting: Neurology

## 2016-09-14 ENCOUNTER — Ambulatory Visit (INDEPENDENT_AMBULATORY_CARE_PROVIDER_SITE_OTHER): Payer: BLUE CROSS/BLUE SHIELD | Admitting: Neurology

## 2016-09-14 VITALS — BP 118/60 | HR 46 | Ht 64.0 in | Wt 132.0 lb

## 2016-09-14 DIAGNOSIS — M797 Fibromyalgia: Secondary | ICD-10-CM | POA: Insufficient documentation

## 2016-09-14 DIAGNOSIS — G43009 Migraine without aura, not intractable, without status migrainosus: Secondary | ICD-10-CM | POA: Diagnosis not present

## 2016-09-14 HISTORY — DX: Fibromyalgia: M79.7

## 2016-09-14 MED ORDER — MILNACIPRAN HCL 12.5 MG PO TABS: 12.5000 mg | ORAL_TABLET | Freq: Every day | ORAL | 1 refills | Status: DC

## 2016-09-14 MED ORDER — TIZANIDINE HCL 2 MG PO TABS: 2.0000 mg | ORAL_TABLET | Freq: Four times a day (QID) | ORAL | 3 refills | Status: DC | PRN

## 2016-09-14 NOTE — Progress Notes (Signed)
Reason for visit: Fibromyalgia  Brenda Smith is an 66 y.o. female  History of present illness:  Brenda Smith is a 66 year old right-handed white female with a history of migraine headache, fibromyalgia, and irritable bowel syndrome. The patient indicates that her migraine headaches have not been a problem for 3 years or more. The patient in the past has been treated with a multitude of medications for her fibromyalgia symptoms, she could not tolerate nortriptyline or gabapentin. Tizanidine in the past was helpful for her back spasms. The patient is involved with yoga and she gets acupuncture treatments on a regular basis which are helpful. The pain that she has is migratory in the shoulders, arms, back, and upper legs. The patient is sleeping fairly well. She tries to stay active but if she overdoes with her physical activity she will have increased discomfort. The patient has a lot of irritable bowel syndrome symptoms with constipation and diarrhea. The patient also reports bilateral tinnitus. She returns to this office for an evaluation.  Past Medical History:  Diagnosis Date  . Anxiety disorder   . Fibromyalgia 09/14/2016  . Glaucoma   . IBS (irritable bowel syndrome)   . Low iron   . Migraine headache   . Myalgia and myositis, unspecified     Past Surgical History:  Procedure Laterality Date  . MOHS SURGERY     Squamous cell  . TONSILLECTOMY      Family History  Problem Relation Age of Onset  . Heart attack Mother   . Cancer Mother   . Heart attack Father   . Gout Father   . Anxiety disorder Father   . Heart disease Brother   . Headache Maternal Grandmother   . Heart disease Maternal Grandmother     Social history:  reports that she has never smoked. She has never used smokeless tobacco. She reports that she drinks alcohol. She reports that she does not use drugs.    Allergies  Allergen Reactions  . Penicillins Anaphylaxis and Hives    Mouth becomes swollen,  trouble swallowing  . Pamelor [Nortriptyline Hcl]   . Topamax [Topiramate]     Medications:  Prior to Admission medications   Medication Sig Start Date End Date Taking? Authorizing Provider  brimonidine (ALPHAGAN P) 0.1 % SOLN Place 1 drop into both eyes 2 (two) times daily.   Yes [provider]  desonide (DESOWEN) 0.05 % ointment Apply 1 application topically 2 (two) times daily. Reported on 08/19/2015   Yes [provider]  fluticasone Asencion Islam) 50 MCG/ACT nasal spray  12/21/15  Yes [provider]  HYDROcodone-acetaminophen (NORCO) 10-325 MG per tablet Take 1 tablet by mouth every 6 (six) hours as needed for pain.   Yes [provider]  loratadine (CLARITIN) 10 MG tablet Take 10 mg by mouth.   Yes [provider]  LORazepam (ATIVAN) 1 MG tablet Take 0.5 mg by mouth as needed. 1 1/2 tablet daily 07/03/12  Yes [provider]  PROAIR HFA 108 (90 Base) MCG/ACT inhaler INHALE 1 PUFF EVERY 4 HOURS AS NEEDED FOR WHEEZING/SHORTNESS OF BREATH 08/24/16  Yes [provider]  SYMBICORT 80-4.5 MCG/ACT inhaler Inhale 2 puffs into the lungs 2 (two) times daily.  09/09/16  Yes [provider]  XOPENEX HFA 45 MCG/ACT inhaler Inhale 1 puff into the lungs as needed.  05/09/13  Yes [provider]    ROS:  Out of a complete 14 system review of symptoms, the patient complains only  of the following symptoms, and all other reviewed systems are negative.  Ringing in the ears Constipation, diarrhea Back pain, achy muscles, neck pain Anxiety Right ear congestion  Blood pressure 118/60, pulse (!) 46, height 5\' 4"  (1.626 m), weight 132 lb (59.9 kg), SpO2 98 %.  Physical Exam  General: The patient is alert and cooperative at the time of the examination.  Skin: No significant peripheral edema is noted.   Neurologic Exam  Mental status: The patient is alert and oriented x 3 at the time of the examination. The patient has apparent  normal recent and remote memory, with an apparently normal attention span and concentration ability.   Cranial nerves: Facial symmetry is present. Speech is normal, no aphasia or dysarthria is noted. Extraocular movements are full. Visual fields are full.  Motor: The patient has good strength in all 4 extremities.  Sensory examination: Soft touch sensation is symmetric on the face, arms, and legs.  Coordination: The patient has good finger-nose-finger and heel-to-shin bilaterally.  Gait and station: The patient has a normal gait. Tandem gait is normal. Romberg is negative. No drift is seen.  Reflexes: Deep tendon reflexes are symmetric.   Assessment/Plan:  1. Fibromyalgia  2. Migraine headache, improved  The patient continues to have neuromuscular discomfort, she has not tried Lyrica or Savella which are indicated for fibromyalgia. The patient is willing to try low-dose of Savella, a prescription was called in. In the past, she got benefit with intermittent use of tizanidine, a prescription was sent in. She will follow-up in 6 months, sooner if needed.   Jill Alexanders MD 09/14/2016 9:22 AM  [facility] [address] [place], [place] [address]  Phone [phone] Fax [phone]

## 2016-09-14 NOTE — Patient Instructions (Signed)
   WE will start Savella 12.5 mg daily, call for any dose adjustments. Tizanidine will be given to take if needed for spasms.

## 2016-09-30 DIAGNOSIS — H401231 Low-tension glaucoma, bilateral, mild stage: Secondary | ICD-10-CM | POA: Diagnosis not present

## 2016-10-12 DIAGNOSIS — R1030 Lower abdominal pain, unspecified: Secondary | ICD-10-CM | POA: Diagnosis not present

## 2016-10-26 DIAGNOSIS — Z85828 Personal history of other malignant neoplasm of skin: Secondary | ICD-10-CM | POA: Diagnosis not present

## 2016-10-26 DIAGNOSIS — D2230 Melanocytic nevi of unspecified part of face: Secondary | ICD-10-CM | POA: Diagnosis not present

## 2016-10-26 DIAGNOSIS — L570 Actinic keratosis: Secondary | ICD-10-CM | POA: Diagnosis not present

## 2016-10-26 DIAGNOSIS — D225 Melanocytic nevi of trunk: Secondary | ICD-10-CM | POA: Diagnosis not present

## 2016-10-26 DIAGNOSIS — L821 Other seborrheic keratosis: Secondary | ICD-10-CM | POA: Diagnosis not present

## 2016-11-01 DIAGNOSIS — F411 Generalized anxiety disorder: Secondary | ICD-10-CM | POA: Diagnosis not present

## 2016-11-03 ENCOUNTER — Other Ambulatory Visit: Payer: Self-pay | Admitting: Neurology

## 2016-11-08 ENCOUNTER — Telehealth: Payer: Self-pay | Admitting: Neurology

## 2016-11-08 NOTE — Telephone Encounter (Signed)
Patient called and requested to speak with someone regarding some reactions she is having to her medication for nerve pain. She states that she is experiencing severe anxiety and the inability to sleep. Please call and advise.

## 2016-11-08 NOTE — Telephone Encounter (Signed)
I called the patient. The patient is having some increased anxiety, insomnia, not clear that this is elated to [facility]. The patient will take Ativan 0.5 mg tablet, taking one half tablet twice daily. She may take Benadryl at night for sleep if needed.

## 2016-11-17 DIAGNOSIS — F411 Generalized anxiety disorder: Secondary | ICD-10-CM | POA: Diagnosis not present

## 2016-11-21 DIAGNOSIS — H401231 Low-tension glaucoma, bilateral, mild stage: Secondary | ICD-10-CM | POA: Diagnosis not present

## 2016-12-01 DIAGNOSIS — Z0000 Encounter for general adult medical examination without abnormal findings: Secondary | ICD-10-CM | POA: Diagnosis not present

## 2016-12-01 DIAGNOSIS — R7303 Prediabetes: Secondary | ICD-10-CM | POA: Diagnosis not present

## 2016-12-01 DIAGNOSIS — F411 Generalized anxiety disorder: Secondary | ICD-10-CM | POA: Diagnosis not present

## 2016-12-01 DIAGNOSIS — M797 Fibromyalgia: Secondary | ICD-10-CM | POA: Diagnosis not present

## 2016-12-01 DIAGNOSIS — F419 Anxiety disorder, unspecified: Secondary | ICD-10-CM | POA: Diagnosis not present

## 2016-12-05 MED ORDER — PREGABALIN 25 MG PO CAPS: 25.0000 mg | ORAL_CAPSULE | Freq: Two times a day (BID) | ORAL | 2 refills | Status: DC

## 2016-12-05 NOTE — Telephone Encounter (Signed)
I called patient. The patient has not been tolerating Savella, this is making her nervous. She will stop the medication.  She has been on Topamax, nortriptyline, and Cymbalta and gabapentin previously without much benefit.  We will try low-dose Lyrica.

## 2016-12-05 NOTE — Telephone Encounter (Signed)
Pt called in she said the benadryl did not help with sleep. She is still having anxiety and now the legs and low back is cramping. She took tizandine on Friday night and Saturday which did help some. She is wanting to taper off savilla due to side effects. She can be reached [phone] and can LVM

## 2016-12-05 NOTE — Addendum Note (Signed)
Addended by: Kathrynn Ducking on: 12/05/2016 03:03 PM   Modules accepted: Orders

## 2016-12-20 DIAGNOSIS — R194 Change in bowel habit: Secondary | ICD-10-CM | POA: Diagnosis not present

## 2016-12-20 DIAGNOSIS — K5901 Slow transit constipation: Secondary | ICD-10-CM | POA: Diagnosis not present

## 2016-12-20 DIAGNOSIS — R1032 Left lower quadrant pain: Secondary | ICD-10-CM | POA: Diagnosis not present

## 2016-12-21 DIAGNOSIS — F411 Generalized anxiety disorder: Secondary | ICD-10-CM | POA: Diagnosis not present

## 2017-01-12 DIAGNOSIS — F411 Generalized anxiety disorder: Secondary | ICD-10-CM | POA: Diagnosis not present

## 2017-01-30 DIAGNOSIS — F419 Anxiety disorder, unspecified: Secondary | ICD-10-CM | POA: Diagnosis not present

## 2017-01-30 DIAGNOSIS — J4520 Mild intermittent asthma, uncomplicated: Secondary | ICD-10-CM | POA: Diagnosis not present

## 2017-01-30 DIAGNOSIS — R7303 Prediabetes: Secondary | ICD-10-CM | POA: Diagnosis not present

## 2017-01-30 DIAGNOSIS — M797 Fibromyalgia: Secondary | ICD-10-CM | POA: Diagnosis not present

## 2017-01-30 DIAGNOSIS — Z23 Encounter for immunization: Secondary | ICD-10-CM | POA: Diagnosis not present

## 2017-02-09 DIAGNOSIS — F411 Generalized anxiety disorder: Secondary | ICD-10-CM | POA: Diagnosis not present

## 2017-02-20 DIAGNOSIS — F419 Anxiety disorder, unspecified: Secondary | ICD-10-CM | POA: Diagnosis not present

## 2017-02-23 DIAGNOSIS — R140 Abdominal distension (gaseous): Secondary | ICD-10-CM | POA: Diagnosis not present

## 2017-02-23 DIAGNOSIS — R1032 Left lower quadrant pain: Secondary | ICD-10-CM | POA: Diagnosis not present

## 2017-02-23 DIAGNOSIS — R110 Nausea: Secondary | ICD-10-CM | POA: Diagnosis not present

## 2017-02-23 DIAGNOSIS — K5904 Chronic idiopathic constipation: Secondary | ICD-10-CM | POA: Diagnosis not present

## 2017-03-14 DIAGNOSIS — F4322 Adjustment disorder with anxiety: Secondary | ICD-10-CM | POA: Diagnosis not present

## 2017-03-15 DIAGNOSIS — F411 Generalized anxiety disorder: Secondary | ICD-10-CM | POA: Diagnosis not present

## 2017-03-22 ENCOUNTER — Ambulatory Visit (INDEPENDENT_AMBULATORY_CARE_PROVIDER_SITE_OTHER): Payer: BLUE CROSS/BLUE SHIELD | Admitting: Adult Health

## 2017-03-22 ENCOUNTER — Encounter: Payer: Self-pay | Admitting: Adult Health

## 2017-03-22 VITALS — BP 106/63 | HR 48 | Wt 128.0 lb

## 2017-03-22 DIAGNOSIS — M797 Fibromyalgia: Secondary | ICD-10-CM

## 2017-03-22 DIAGNOSIS — G4489 Other headache syndrome: Secondary | ICD-10-CM | POA: Diagnosis not present

## 2017-03-22 NOTE — Patient Instructions (Signed)
Your Plan:  Continue regular follow-ups with Dr. Collene Mares and Dr. Casimiro Needle If you want to start lyrica let us know If headaches worsen or increase in frequency please let us know If your symptoms worsen or you develop new symptoms please let us know.   Thank you for coming to see [place] at [facility]. I hope we have been able to provide you high quality care today.  You may receive a patient satisfaction survey over the next few weeks. We would appreciate your feedback and comments so that we may continue to improve ourselves and the health of our patients.

## 2017-03-22 NOTE — Progress Notes (Signed)
I have read the note, and I agree with the clinical assessment and plan.  Charles K Willis   

## 2017-03-22 NOTE — Progress Notes (Signed)
PATIENT: Brenda Smith DOB: 1950-05-19  REASON FOR VISIT: follow up HISTORY FROM: patient  HISTORY OF PRESENT ILLNESS: Today 03/22/17 Brenda Smith is a 67 year old female with a history of migraine headaches, fibromyalgia and irritable bowel syndrome.  She returns today for follow-up.  She states that she did try Savella but she felt that it made her anxiety worse as well as her IBS symptoms.  She states that she did stop the medication but she continued to have issues with anxiety and IBS.  She followed up with Dr. Collene Mares who now has her on Linzess.  She states that this has been working well for her.  She saw Dr. Casimiro Needle for anxiety.  She reports that due to her intolerance of most medication he has recommended genetic testing.  She has not had this completed yet.  At this time she reports that her discomfort related to fibromyalgia is tolerable.  She states that she still does yoga, stretching and deep breathing exercises.  She returns today for an evaluation.  HISTORY 09/14/16: Brenda Smith is a 67 year old right-handed white female with a history of migraine headache, fibromyalgia, and irritable bowel syndrome. The patient indicates that her migraine headaches have not been a problem for 3 years or more. The patient in the past has been treated with a multitude of medications for her fibromyalgia symptoms, she could not tolerate nortriptyline or gabapentin. Tizanidine in the past was helpful for her back spasms. The patient is involved with yoga and she gets acupuncture treatments on a regular basis which are helpful. The pain that she has is migratory in the shoulders, arms, back, and upper legs. The patient is sleeping fairly well. She tries to stay active but if she overdoes with her physical activity she will have increased discomfort. The patient has a lot of irritable bowel syndrome symptoms with constipation and diarrhea. The patient also reports bilateral tinnitus. She returns to this  office for an evaluation.  REVIEW OF SYSTEMS: Out of a complete 14 system review of symptoms, the patient complains only of the following symptoms, and all other reviewed systems are negative.  ALLERGIES: Allergies  Allergen Reactions  . Penicillins Anaphylaxis and Hives    Mouth becomes swollen, trouble swallowing  . Pamelor [Nortriptyline Hcl]   . Topamax [Topiramate]     HOME MEDICATIONS: Outpatient Medications Prior to Visit  Medication Sig Dispense Refill  . brimonidine (ALPHAGAN P) 0.1 % SOLN Place 1 drop into both eyes 2 (two) times daily.    Marland Kitchen desonide (DESOWEN) 0.05 % ointment Apply 1 application topically 2 (two) times daily. Reported on 08/19/2015    . fluticasone (FLONASE) 50 MCG/ACT nasal spray     . HYDROcodone-acetaminophen (NORCO) 10-325 MG per tablet Take 1 tablet by mouth every 6 (six) hours as needed for pain.    Marland Kitchen linaclotide (LINZESS) 72 MCG capsule Take 72 mcg by mouth daily before breakfast.    . loratadine (CLARITIN) 10 MG tablet Take 10 mg by mouth.    Marland Kitchen LORazepam (ATIVAN) 1 MG tablet Take 0.5 mg by mouth as needed. 1 1/2 tablet daily    . PROAIR HFA 108 (90 Base) MCG/ACT inhaler INHALE 1 PUFF EVERY 4 HOURS AS NEEDED FOR WHEEZING/SHORTNESS OF BREATH  5  . tiZANidine (ZANAFLEX) 2 MG tablet Take 1 tablet (2 mg total) by mouth every 6 (six) hours as needed for muscle spasms. 90 tablet 3  . UNABLE TO FIND Med Name: hycosamine 0.125 as needed    .  pregabalin (LYRICA) 25 MG capsule Take 1 capsule (25 mg total) by mouth 2 (two) times daily. (Patient not taking: Reported on 03/22/2017) 60 capsule 2  . SYMBICORT 80-4.5 MCG/ACT inhaler Inhale 2 puffs into the lungs 2 (two) times daily.     Penne Lash HFA 45 MCG/ACT inhaler Inhale 1 puff into the lungs as needed.      No facility-administered medications prior to visit.     PAST MEDICAL HISTORY: Past Medical History:  Diagnosis Date  . Anxiety disorder   . Fibromyalgia 09/14/2016  . Glaucoma   . IBS (irritable bowel  syndrome)   . Low iron   . Migraine headache   . Myalgia and myositis, unspecified     PAST SURGICAL HISTORY: Past Surgical History:  Procedure Laterality Date  . MOHS SURGERY     Squamous cell  . TONSILLECTOMY      FAMILY HISTORY: Family History  Problem Relation Age of Onset  . Heart attack Mother   . Cancer Mother   . Heart attack Father   . Gout Father   . Anxiety disorder Father   . Heart disease Brother   . Headache Maternal Grandmother   . Heart disease Maternal Grandmother     SOCIAL HISTORY: Social History   Socioeconomic History  . Marital status: Married    Spouse name: Not on file  . Number of children: 0  . Years of education: PHD  . Highest education level: Not on file  Social Needs  . Financial resource strain: Not on file  . Food insecurity - worry: Not on file  . Food insecurity - inability: Not on file  . Transportation needs - medical: Not on file  . Transportation needs - non-medical: Not on file  Occupational History  . Not on file  Tobacco Use  . Smoking status: Never Smoker  . Smokeless tobacco: Never Used  Substance and Sexual Activity  . Alcohol use: Yes    Comment: Drinks wine on occasion  . Drug use: No  . Sexual activity: No  Other Topics Concern  . Not on file  Social History Narrative   Patient is married, no children.   Patient is right handed.   Patient has her PhD.   Patient drinks decaffeinated tea.      PHYSICAL EXAM  Vitals:   03/22/17 1015  BP: 106/63  Pulse: (!) 48  Weight: 128 lb (58.1 kg)   Body mass index is 21.97 kg/m.  Generalized: Well developed, in no acute distress   Neurological examination  Mentation: Alert oriented to time, place, history taking. Follows all commands speech and language fluent Cranial nerve II-XII: Pupils were equal round reactive to light. Extraocular movements were full, visual field were full on confrontational test. Facial sensation and strength were normal. Uvula tongue  midline. Head turning and shoulder shrug  were normal and symmetric. Motor: The motor testing reveals 5 over 5 strength of all 4 extremities. Good symmetric motor tone is noted throughout.  Sensory: Sensory testing is intact to soft touch on all 4 extremities. No evidence of extinction is noted.  Coordination: Cerebellar testing reveals good finger-nose-finger and heel-to-shin bilaterally.  Gait and station: Gait is normal.  Reflexes: Deep tendon reflexes are symmetric and normal bilaterally.   DIAGNOSTIC DATA (LABS, IMAGING, TESTING) - I reviewed patient records, labs, notes, testing and imaging myself where available.     ASSESSMENT AND PLAN 67 y.o. year old female  has a past medical history of Anxiety disorder,  Fibromyalgia (09/14/2016), Glaucoma, IBS (irritable bowel syndrome), Low iron, Migraine headache, and Myalgia and myositis, unspecified. here with:  1.  Fibromyalgia 2.  Headache  At this time the patient does not want to change her medication regimen.  She states that she would like to get things settled with Dr. Collene Mares in regards to her digestive issues as well as following up with Dr. Casimiro Needle for anxiety.  In the future she may consider starting low-dose Lyrica.  Right now she is having dull headaches that are relatively infrequent.  I did advise the patient that if her headache frequency increases or the severity increases she should let us know.  She will follow-up in 6 months or sooner if needed.     Ward Givens, MSN, NP-C 03/22/2017, 10:23 AM [facility] [address] [place], [place] [address] [phone]

## 2017-03-30 ENCOUNTER — Telehealth: Payer: Self-pay | Admitting: Adult Health

## 2017-03-30 NOTE — Telephone Encounter (Signed)
LMVM for pt that received message and will forward to MM/NP.

## 2017-03-30 NOTE — Telephone Encounter (Signed)
Pt is calling to let us know she would like to be put on Lyrica sent to [id number] - , [place]

## 2017-04-03 MED ORDER — PREGABALIN 25 MG PO CAPS: 25.0000 mg | ORAL_CAPSULE | Freq: Two times a day (BID) | ORAL | 2 refills | Status: DC

## 2017-04-03 NOTE — Telephone Encounter (Addendum)
Called [facility], spoke with Caryl Pina who stated that the patients' insurance covers Lyrica. However her cost is still $464/month. Caryl Pina stated it does not require prior auth.  Caryl Pina stated the patient was on Gabapentin previously.   Called patient who stated she is on samples of Linzess after side effects from [place].  She stated her stomach issues are improving.  She stated she saw Dr Inda Merlin pcp, for anxiety, was put on zoloft, but it caused headaches. She is now seeing a psychiatrist, Dr Casimiro Needle. He is going to test her to check her metabolism of medications, but the test is not until March.  Patient stated she would like to try something for her nerve pain. This RN asked about Gabapentin she was on in the past. Patient stated she slowly weaned her self off because she was taking it three times a day, but she is willing to try a low dose once daily.  She does not intend to start Lyrica at this time.  This RN stated will route her request for new Rx to NP. She verbalized understanding, appreciation.

## 2017-04-03 NOTE — Telephone Encounter (Signed)
Pt called Lyrica will cost $463/mth. Pt said she can't afford this. She is not wanting to change medication if possible. Please call to advise at [phone]

## 2017-04-03 NOTE — Telephone Encounter (Signed)
Order printed, signed and given to RN

## 2017-04-03 NOTE — Telephone Encounter (Signed)
Lyrica refill Rx successfully faxed to [facility], [address]

## 2017-04-03 NOTE — Addendum Note (Signed)
Addended by: Trudie Buckler on: 04/03/2017 11:49 AM   Modules accepted: Orders

## 2017-04-04 MED ORDER — GABAPENTIN 100 MG PO CAPS: 100.0000 mg | ORAL_CAPSULE | Freq: Every day | ORAL | 5 refills | Status: DC

## 2017-04-04 NOTE — Telephone Encounter (Signed)
I will start patient on gabapentin taking 100 mg at bedtime for the first several weeks.  If she is able to tolerate this we will slowly increase it.  She can call for an increase.

## 2017-04-04 NOTE — Addendum Note (Signed)
Addended by: Trudie Buckler on: 04/04/2017 04:59 PM   Modules accepted: Orders

## 2017-04-05 NOTE — Telephone Encounter (Signed)
Spoke with patient and informed her of gabapentin Rx sent in. Advised she try for at least 2-3 weeks then call back to update this RN. Advised her the medication can be slowly increased as needed. Patient verbalized understanding, appreciation.

## 2017-04-12 DIAGNOSIS — F411 Generalized anxiety disorder: Secondary | ICD-10-CM | POA: Diagnosis not present

## 2017-05-10 DIAGNOSIS — F411 Generalized anxiety disorder: Secondary | ICD-10-CM | POA: Diagnosis not present

## 2017-05-17 ENCOUNTER — Ambulatory Visit (INDEPENDENT_AMBULATORY_CARE_PROVIDER_SITE_OTHER): Payer: BLUE CROSS/BLUE SHIELD | Admitting: Psychiatry

## 2017-05-17 ENCOUNTER — Encounter (HOSPITAL_COMMUNITY): Payer: Self-pay | Admitting: Psychiatry

## 2017-05-17 VITALS — BP 116/70 | HR 61 | Ht 62.0 in | Wt 130.0 lb

## 2017-05-17 DIAGNOSIS — F321 Major depressive disorder, single episode, moderate: Secondary | ICD-10-CM | POA: Diagnosis not present

## 2017-05-17 DIAGNOSIS — F4323 Adjustment disorder with mixed anxiety and depressed mood: Secondary | ICD-10-CM

## 2017-05-17 DIAGNOSIS — Z818 Family history of other mental and behavioral disorders: Secondary | ICD-10-CM | POA: Diagnosis not present

## 2017-05-17 DIAGNOSIS — F900 Attention-deficit hyperactivity disorder, predominantly inattentive type: Secondary | ICD-10-CM | POA: Diagnosis not present

## 2017-05-17 NOTE — Progress Notes (Signed)
Psychiatric Initial Adult Assessment   Patient Identification: Brenda Smith MRN:  [id number] Date of Evaluation:  05/17/2017 Referral Source: Dr. Corinna Lines   Chief Complaint:   Visit Diagnosis: No diagnosis found.  History of Present Illness: this patient is a 67 year old married female who was seen in my other office I tried psychiatric counseling Center. She was my patient about the years ago when she was diagnosed panic disorder and treated successfully with Zoloft.After 2 years doing well she stopped the Zoloft without any problems. She's never had any return of panic attacks. This patient actually denies daily depression. She is no clear anhedonia. She likes to read watched TV likes to hike. She's actually sleeping fairly well. She's got good energy.The patient works out 45 minutes a day. The patient's physical complaints is that her hands feel hot and tingly. So the top of her head. She describes a lot of pelvic and thigh muscle pain. She's being treated for fibromyalgia. They attempted to give her Savella but she could not tolerate it. She also went back to Zoloft but had headaches instead. She couldn't tolerate this.The patient sees Dr. Doroteo Glassman on a regular basis. The patient has no evidence of psychosis. She doesn't drink alcohol or use drugs. She is no evidence of mania. There is no clear evidence of major depression at this time in the past. 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 Patient's major complaint is that she doesn't tolerate psychotropic medicinesHe seems to offer for all labeled medications like fibromyalgia but she's not had any success with. She's here today to receive gene testing to assess her metabolic opacities. She certainly is not suicidal nor she homicidal. She actually functions very well. Her only problem is chronic pain and some non-specific symptoms. She's treated for irritable bowel as well as for  fibromyalgia.  Associated Signs/Symptoms: Depression Symptoms:  loss of energy/fatigue, (Hypo) Manic Symptoms:   Anxiety Symptoms:   Psychotic Symptoms:   PTSD Symptoms:   Past Psychiatric History: Zoloft for panic disorder  Previous Psychotropic Medications: Yes   Substance Abuse History in the last 12 months:  No.  Consequences of Substance Abuse:   Past Medical History:  Past Medical History:  Diagnosis Date  . Anxiety disorder   . Fibromyalgia 09/14/2016  . Glaucoma   . IBS (irritable bowel syndrome)   . Low iron   . Migraine headache   . Myalgia and myositis, unspecified     Past Surgical History:  Procedure Laterality Date  . MOHS SURGERY     Squamous cell  . TONSILLECTOMY      Family Psychiatric History:   Family History:  Family History  Problem Relation Age of Onset  . Heart attack Mother   . Cancer Mother   . Heart attack Father   . Gout Father   . Anxiety disorder Father   . Heart disease Brother   . Headache Maternal Grandmother   . Heart disease Maternal Grandmother     Social History:   Social History   Socioeconomic History  . Marital status: Married    Spouse name: None  . Number of children: 0  . Years of education: PHD  . Highest education level: None  Social Needs  . Financial resource strain: None  . Food insecurity - worry: None  . Food insecurity - inability: None  . Transportation needs - medical: None  . Transportation needs - non-medical: None  Occupational History  .  None  Tobacco Use  . Smoking status: Never Smoker  . Smokeless tobacco: Never Used  Substance and Sexual Activity  . Alcohol use: Yes    Comment: Drinks wine on occasion  . Drug use: No  . Sexual activity: Yes    Partners: Male    Birth control/protection: None  Other Topics Concern  . None  Social History Narrative   Patient is married, no children.   Patient is right handed.   Patient has her PhD.   Patient drinks decaffeinated tea.     Additional Social History:   Allergies:   Allergies  Allergen Reactions  . Penicillins Anaphylaxis and Hives    Mouth becomes swollen, trouble swallowing  . Savella [Milnacipran Hcl] Other (See Comments)    "anxiety, constipation"  . Pamelor [Nortriptyline Hcl]   . Topamax [Topiramate]     Metabolic Disorder Labs: No results found for: HGBA1C, MPG No results found for: PROLACTIN No results found for: CHOL, TRIG, HDL, CHOLHDL, VLDL, LDLCALC   Current Medications: Current Outpatient Medications  Medication Sig Dispense Refill  . brimonidine (ALPHAGAN P) 0.1 % SOLN Place 1 drop into both eyes 2 (two) times daily.    Marland Kitchen HYDROcodone-acetaminophen (NORCO) 10-325 MG per tablet Take 1 tablet by mouth every 6 (six) hours as needed for pain.    Marland Kitchen linaclotide (LINZESS) 72 MCG capsule Take 72 mcg by mouth daily before breakfast.    . loratadine (CLARITIN) 10 MG tablet Take 10 mg by mouth.    Marland Kitchen LORazepam (ATIVAN) 1 MG tablet Take 0.5 mg by mouth as needed. 1 1/2 tablet daily    . PROAIR HFA 108 (90 Base) MCG/ACT inhaler INHALE 1 PUFF EVERY 4 HOURS AS NEEDED FOR WHEEZING/SHORTNESS OF BREATH  5  . tiZANidine (ZANAFLEX) 2 MG tablet Take 1 tablet (2 mg total) by mouth every 6 (six) hours as needed for muscle spasms. 90 tablet 3  . UNABLE TO FIND Med Name: hycosamine 0.125 as needed    . desonide (DESOWEN) 0.05 % ointment Apply 1 application topically 2 (two) times daily. Reported on 08/19/2015    . fluticasone (FLONASE) 50 MCG/ACT nasal spray     . gabapentin (NEURONTIN) 100 MG capsule Take 1 capsule (100 mg total) by mouth at bedtime. (Patient not taking: Reported on 05/17/2017) 30 capsule 5  . SYMBICORT 80-4.5 MCG/ACT inhaler Inhale 2 puffs into the lungs 2 (two) times daily.      No current facility-administered medications for this visit.     Neurologic: Headache: No Seizure: No Paresthesias:No  Musculoskeletal: Strength & Muscle Tone: within normal limits Gait & Station:  normal Patient leans: N/A  Psychiatric Specialty Exam: ROS  Blood pressure 116/70, pulse 61, height 5\' 2"  (1.575 m), weight 130 lb (59 kg).Body mass index is 23.78 kg/m.  General Appearance: Casual  Eye Contact:  Good  Speech:  Clear and Coherent  Volume:  Normal  Mood:  Negative  Affect:  Appropriate  Thought Process:  Goal Directed  Orientation:  Full (Time, Place, and Person)  Thought Content:  WDL  Suicidal Thoughts:  No  Homicidal Thoughts:  No  Memory:  Immediate;   Good  Judgement:  Good  Insight:  Good  Psychomotor Activity:  Normal  Concentration:    Recall:  Good  Fund of Knowledge:Good  Language: Good  Akathisia:  No  Handed:  Right  AIMS (if indicated):    Assets:  Communication Skills  ADL's:  Intact  Cognition: WNL  Sleep:  Treatment Plan Summary:  At this time this patient is not receiving any psychotropic medications. Her goal is to assess her metabolic status and to find out why she is tolerating the surgery antidepressants are being used forall labeled purposes. I see if she would benefit from Effexor. I'm also going to get her primary care doctor to send all blood work is done in the last year looking specifically at B12. I suspect a lot of this is psychosomatic in nature. The patient continue in psychotherapy with Dr. Doroteo Glassman. We'll assess her psychosomatic profile to try to better understand these physical symptoms. Having the qualitative information about metabolic opacities I think will be listed helpful for this patient patient is functioning very well. She'll return to see me in approximately 6 week.   Jerral Ralph, MD 3/13/20194:16 PM

## 2017-05-22 DIAGNOSIS — H401231 Low-tension glaucoma, bilateral, mild stage: Secondary | ICD-10-CM | POA: Diagnosis not present

## 2017-06-14 DIAGNOSIS — F411 Generalized anxiety disorder: Secondary | ICD-10-CM | POA: Diagnosis not present

## 2017-06-15 DIAGNOSIS — R7303 Prediabetes: Secondary | ICD-10-CM | POA: Diagnosis not present

## 2017-06-15 DIAGNOSIS — F419 Anxiety disorder, unspecified: Secondary | ICD-10-CM | POA: Diagnosis not present

## 2017-06-15 DIAGNOSIS — M797 Fibromyalgia: Secondary | ICD-10-CM | POA: Diagnosis not present

## 2017-07-07 ENCOUNTER — Encounter (HOSPITAL_COMMUNITY): Payer: Self-pay | Admitting: Psychiatry

## 2017-07-07 ENCOUNTER — Ambulatory Visit (INDEPENDENT_AMBULATORY_CARE_PROVIDER_SITE_OTHER): Payer: BLUE CROSS/BLUE SHIELD | Admitting: Psychiatry

## 2017-07-07 ENCOUNTER — Other Ambulatory Visit: Payer: Self-pay

## 2017-07-07 VITALS — BP 112/75 | HR 50 | Temp 97.5°F | Wt 132.4 lb

## 2017-07-07 DIAGNOSIS — Z818 Family history of other mental and behavioral disorders: Secondary | ICD-10-CM

## 2017-07-07 DIAGNOSIS — F4322 Adjustment disorder with anxiety: Secondary | ICD-10-CM | POA: Diagnosis not present

## 2017-07-07 DIAGNOSIS — M549 Dorsalgia, unspecified: Secondary | ICD-10-CM

## 2017-07-07 NOTE — Progress Notes (Signed)
Psychiatric Initial Adult Assessment   Patient Identification: Brenda Smith MRN:  [id number] Date of Evaluation:  07/07/2017 Referral Source: Dr. Corinna Lines   Chief Complaint:   Visit Diagnosis: No diagnosis found.  History of Present Illness:  Today the patient is doing well. She gets acupuncture for her pain. She's taking a new medicine for irritable bowel call Linzess which actually is helping her pain. Her pain is mainly lower back pain and accommodation of these agents together with acupuncture is made her feel much better. She denies daily depression. In our clinic she recently had [facility] site testing indeterminate her metabolic status. The patient continues in therapy. Patient is really doing well. She sleeps well. She has good energy. She is inconsistent responses to Zoloft in the past. Initially years ago she did for panic disorder and is very well. When she was rechallenged with again the last year she had a number of side effects from it. The patient is going through a lot of issues related to adjusting to her age affect that she's retired 26 years and she purpose and passion. At this time her fasting demonstrated that she was within the normal average range tolerating medications and not having a metabolic problems with that. The patient doesn't drink alcohol or use drugs or he is very stable. Her pain is relatively well controlled. She shows no evidence of a panic attack or of clinical depression. The patient sees Dr. Jyl Heinz. The patient is doing medically emotionally and physically fairly well.  Associated Signs/Symptoms: Depression Symptoms:  loss of energy/fatigue, (Hypo) Manic Symptoms:   Anxiety Symptoms:   Psychotic Symptoms:   PTSD Symptoms:   Past Psychiatric History: Zoloft for panic disorder  Previous Psychotropic Medications: Yes   Substance Abuse History in the last 12 months:  No.  Consequences of Substance Abuse:   Past Medical History:  Past Medical  History:  Diagnosis Date  . Anxiety disorder   . Fibromyalgia 09/14/2016  . Glaucoma   . IBS (irritable bowel syndrome)   . Low iron   . Migraine headache   . Myalgia and myositis, unspecified     Past Surgical History:  Procedure Laterality Date  . MOHS SURGERY     Squamous cell  . TONSILLECTOMY      Family Psychiatric History:   Family History:  Family History  Problem Relation Age of Onset  . Heart attack Mother   . Cancer Mother   . Heart attack Father   . Gout Father   . Anxiety disorder Father   . Heart disease Brother   . Headache Maternal Grandmother   . Heart disease Maternal Grandmother     Social History:   Social History   Socioeconomic History  . Marital status: Married    Spouse name: Not on file  . Number of children: 0  . Years of education: PHD  . Highest education level: Not on file  Occupational History  . Not on file  Social Needs  . Financial resource strain: Not on file  . Food insecurity:    Worry: Not on file    Inability: Not on file  . Transportation needs:    Medical: Not on file    Non-medical: Not on file  Tobacco Use  . Smoking status: Never Smoker  . Smokeless tobacco: Never Used  Substance and Sexual Activity  . Alcohol use: Yes    Comment: Drinks wine on occasion  . Drug use: No  . Sexual activity: Yes  Partners: Male    Birth control/protection: None  Lifestyle  . Physical activity:    Days per week: Not on file    Minutes per session: Not on file  . Stress: Not on file  Relationships  . Social connections:    Talks on phone: Not on file    Gets together: Not on file    Attends religious service: Not on file    Active member of club or organization: Not on file    Attends meetings of clubs or organizations: Not on file    Relationship status: Not on file  Other Topics Concern  . Not on file  Social History Narrative   Patient is married, no children.   Patient is right handed.   Patient has her PhD.    Patient drinks decaffeinated tea.    Additional Social History:   Allergies:   Allergies  Allergen Reactions  . Penicillins Anaphylaxis and Hives    Mouth becomes swollen, trouble swallowing  . Savella [Milnacipran Hcl] Other (See Comments)    "anxiety, constipation"  . Pamelor [Nortriptyline Hcl]   . Topamax [Topiramate]     Metabolic Disorder Labs: No results found for: HGBA1C, MPG No results found for: PROLACTIN No results found for: CHOL, TRIG, HDL, CHOLHDL, VLDL, LDLCALC   Current Medications: Current Outpatient Medications  Medication Sig Dispense Refill  . brimonidine (ALPHAGAN P) 0.1 % SOLN Place 1 drop into both eyes 2 (two) times daily.    Marland Kitchen desonide (DESOWEN) 0.05 % ointment Apply 1 application topically 2 (two) times daily. Reported on 08/19/2015    . fluticasone (FLONASE) 50 MCG/ACT nasal spray     . HYDROcodone-acetaminophen (NORCO) 10-325 MG per tablet Take 1 tablet by mouth every 6 (six) hours as needed for pain.    Marland Kitchen linaclotide (LINZESS) 72 MCG capsule Take 72 mcg by mouth daily before breakfast.    . loratadine (CLARITIN) 10 MG tablet Take 10 mg by mouth.    Marland Kitchen LORazepam (ATIVAN) 1 MG tablet Take 0.5 mg by mouth as needed. 1 1/2 tablet daily    . PROAIR HFA 108 (90 Base) MCG/ACT inhaler INHALE 1 PUFF EVERY 4 HOURS AS NEEDED FOR WHEEZING/SHORTNESS OF BREATH  5  . tiZANidine (ZANAFLEX) 2 MG tablet Take 1 tablet (2 mg total) by mouth every 6 (six) hours as needed for muscle spasms. 90 tablet 3  . UNABLE TO FIND Med Name: hycosamine 0.125 as needed     No current facility-administered medications for this visit.     Neurologic: Headache: No Seizure: No Paresthesias:No  Musculoskeletal: Strength & Muscle Tone: within normal limits Gait & Station: normal Patient leans: N/A  Psychiatric Specialty Exam: ROS  Blood pressure 112/75, pulse (!) 50, temperature (!) 97.5 F (36.4 C), temperature source Oral, weight 132 lb 6.4 oz (60.1 kg).Body mass index is  24.22 kg/m.  General Appearance: Casual  Eye Contact:  Good  Speech:  Clear and Coherent  Volume:  Normal  Mood:  Negative  Affect:  Appropriate  Thought Process:  Goal Directed  Orientation:  Full (Time, Place, and Person)  Thought Content:  WDL  Suicidal Thoughts:  No  Homicidal Thoughts:  No  Memory:  Immediate;   Good  Judgement:  Good  Insight:  Good  Psychomotor Activity:  Normal  Concentration:    Recall:  Good  Fund of Knowledge:Good  Language: Good  Akathisia:  No  Handed:  Right  AIMS (if indicated):    Assets:  Communication Skills  ADL's:  Intact  Cognition: WNL  Sleep:      Treatment Plan Summary: At this time the patient will continue seeDr. Doroteo Glassman. I will offer her no medications at this time. We'll keep her Gene testing resultsin our system showed we have perhaps consider getting her psychotropic medication. At this time the patient is doing very well. She'll return to see me in 6 month for a 30 minute recheck. The possibility of any care at that time will be considered. She doing very well at this time. Her only issue is dealing with the cost of the bill for the testing. At this time the patient will continue taking Ativan 1 mg taking a half in the morning and half at night as prescribed by her primary care doctor. According to the Gene metabolic testing this is certainly an appropriate dose.  Jerral Ralph, MD 5/3/201911:32 AM

## 2017-07-10 DIAGNOSIS — F411 Generalized anxiety disorder: Secondary | ICD-10-CM | POA: Diagnosis not present

## 2017-08-16 DIAGNOSIS — Z01419 Encounter for gynecological examination (general) (routine) without abnormal findings: Secondary | ICD-10-CM | POA: Diagnosis not present

## 2017-08-16 DIAGNOSIS — Z6823 Body mass index (BMI) 23.0-23.9, adult: Secondary | ICD-10-CM | POA: Diagnosis not present

## 2017-09-04 DIAGNOSIS — F411 Generalized anxiety disorder: Secondary | ICD-10-CM | POA: Diagnosis not present

## 2017-09-25 ENCOUNTER — Ambulatory Visit (INDEPENDENT_AMBULATORY_CARE_PROVIDER_SITE_OTHER): Payer: BLUE CROSS/BLUE SHIELD | Admitting: Adult Health

## 2017-09-25 ENCOUNTER — Encounter: Payer: Self-pay | Admitting: Adult Health

## 2017-09-25 VITALS — BP 121/67 | HR 48 | Ht 63.0 in | Wt 137.2 lb

## 2017-09-25 DIAGNOSIS — G4489 Other headache syndrome: Secondary | ICD-10-CM | POA: Diagnosis not present

## 2017-09-25 DIAGNOSIS — M797 Fibromyalgia: Secondary | ICD-10-CM

## 2017-09-25 DIAGNOSIS — R252 Cramp and spasm: Secondary | ICD-10-CM | POA: Diagnosis not present

## 2017-09-25 NOTE — Progress Notes (Signed)
I have read the note, and I agree with the clinical assessment and plan.  Charles K Willis   

## 2017-09-25 NOTE — Addendum Note (Signed)
Addended by: Trudie Buckler on: 09/25/2017 11:26 AM   Modules accepted: Orders

## 2017-09-25 NOTE — Progress Notes (Signed)
PATIENT: Brenda Smith DOB: June 04, 1950  REASON FOR VISIT: follow up HISTORY FROM: patient  HISTORY OF PRESENT ILLNESS: Today 09/25/17 :  Brenda Smith is a 67 year old female with a history of migraine headaches, fibromyalgia and irritable bowel syndrome.  She returns today for follow-up.  She states that she 3-4 headaches a month.  She states on occasion she will wake up with a sharp shooting pain in the occipital region.  This typically only last a half an hour to an hour.  She states that she can deep breathe and the headache typically resolves fairly quickly.  She states with  Linzess her nerve pain has improved.  But she has noticed more muscle cramps and muscle pain.  She states that she has an episode about every 2 weeks.  She reports that she typically can take tizanidine and her symptoms improved.  She reports that she is staying hydrated.  She also does stretching and yoga daily.  She returns today for evaluation.  HISTORY 03/22/17 Brenda Smith is a 67 year old female with a history of migraine headaches, fibromyalgia and irritable bowel syndrome.  She returns today for follow-up.  She states that she did try Savella but she felt that it made her anxiety worse as well as her IBS symptoms.  She states that she did stop the medication but she continued to have issues with anxiety and IBS.  She followed up with Dr. Collene Mares who now has her on Linzess.  She states that this has been working well for her.  She saw Dr. Casimiro Needle for anxiety.  She reports that due to her intolerance of most medication he has recommended genetic testing.  She has not had this completed yet.  At this time she reports that her discomfort related to fibromyalgia is tolerable.  She states that she still does yoga, stretching and deep breathing exercises.  She returns today for an evaluation.   REVIEW OF SYSTEMS: Out of a complete 14 system review of symptoms, the patient complains only of the following symptoms, and  all other reviewed systems are negative.  Headache, back pain, muscle cramps, aching muscles  ALLERGIES: Allergies  Allergen Reactions  . Penicillins Anaphylaxis and Hives    Mouth becomes swollen, trouble swallowing  . Savella [Milnacipran Hcl] Other (See Comments)    "anxiety, constipation"  . Pamelor [Nortriptyline Hcl]   . Topamax [Topiramate]     HOME MEDICATIONS: Outpatient Medications Prior to Visit  Medication Sig Dispense Refill  . brimonidine (ALPHAGAN P) 0.1 % SOLN Place 1 drop into both eyes 2 (two) times daily.    . fluticasone (FLONASE) 50 MCG/ACT nasal spray     . HYDROcodone-acetaminophen (NORCO) 10-325 MG per tablet Take 1 tablet by mouth every 6 (six) hours as needed for pain.    Marland Kitchen linaclotide (LINZESS) 72 MCG capsule Take 72 mcg by mouth daily before breakfast.    . loratadine (CLARITIN) 10 MG tablet Take 10 mg by mouth.    Marland Kitchen LORazepam (ATIVAN) 1 MG tablet Take 0.5 mg by mouth as needed. 1 1/2 tablet daily    . PROAIR HFA 108 (90 Base) MCG/ACT inhaler INHALE 1 PUFF EVERY 4 HOURS AS NEEDED FOR WHEEZING/SHORTNESS OF BREATH  5  . tiZANidine (ZANAFLEX) 2 MG tablet Take 1 tablet (2 mg total) by mouth every 6 (six) hours as needed for muscle spasms. 90 tablet 3  . UNABLE TO FIND Med Name: hycosamine 0.125 as needed    . desonide (DESOWEN) 0.05 % ointment  Apply 1 application topically 2 (two) times daily. Reported on 08/19/2015     No facility-administered medications prior to visit.     PAST MEDICAL HISTORY: Past Medical History:  Diagnosis Date  . Anxiety disorder   . Fibromyalgia 09/14/2016  . Glaucoma   . IBS (irritable bowel syndrome)   . Low iron   . Migraine headache   . Myalgia and myositis, unspecified     PAST SURGICAL HISTORY: Past Surgical History:  Procedure Laterality Date  . MOHS SURGERY     Squamous cell  . TONSILLECTOMY      FAMILY HISTORY: Family History  Problem Relation Age of Onset  . Heart attack Mother   . Cancer Mother   .  Heart attack Father   . Gout Father   . Anxiety disorder Father   . Heart disease Brother   . Headache Maternal Grandmother   . Heart disease Maternal Grandmother     SOCIAL HISTORY: Social History   Socioeconomic History  . Marital status: Married    Spouse name: Not on file  . Number of children: 0  . Years of education: PHD  . Highest education level: Not on file  Occupational History  . Not on file  Social Needs  . Financial resource strain: Not on file  . Food insecurity:    Worry: Not on file    Inability: Not on file  . Transportation needs:    Medical: Not on file    Non-medical: Not on file  Tobacco Use  . Smoking status: Never Smoker  . Smokeless tobacco: Never Used  Substance and Sexual Activity  . Alcohol use: Yes    Comment: Drinks wine on occasion  . Drug use: No  . Sexual activity: Yes    Partners: Male    Birth control/protection: None  Lifestyle  . Physical activity:    Days per week: Not on file    Minutes per session: Not on file  . Stress: Not on file  Relationships  . Social connections:    Talks on phone: Not on file    Gets together: Not on file    Attends religious service: Not on file    Active member of club or organization: Not on file    Attends meetings of clubs or organizations: Not on file    Relationship status: Not on file  . Intimate partner violence:    Fear of current or ex partner: Not on file    Emotionally abused: Not on file    Physically abused: Not on file    Forced sexual activity: Not on file  Other Topics Concern  . Not on file  Social History Narrative   Patient is married, no children.   Patient is right handed.   Patient has her PhD.   Patient drinks decaffeinated tea.      PHYSICAL EXAM  Vitals:   09/25/17 1010  BP: 121/67  Pulse: (!) 48  Weight: 137 lb 3.2 oz (62.2 kg)  Height: 5\' 3"  (1.6 m)   Body mass index is 24.3 kg/m.  Generalized: Well developed, in no acute distress   Neurological  examination  Mentation: Alert oriented to time, place, history taking. Follows all commands speech and language fluent Cranial nerve II-XII: Pupils were equal round reactive to light. Extraocular movements were full, visual field were full on confrontational test. Facial sensation and strength were normal. Uvula tongue midline. Head turning and shoulder shrug  were normal and symmetric. Motor: The  motor testing reveals 5 over 5 strength of all 4 extremities. Good symmetric motor tone is noted throughout.  Sensory: Sensory testing is intact to soft touch on all 4 extremities. No evidence of extinction is noted.  Coordination: Cerebellar testing reveals good finger-nose-finger and heel-to-shin bilaterally.  Gait and station: Gait is normal. Tandem gait is normal. Romberg is negative. No drift is seen.  Reflexes: Deep tendon reflexes are symmetric and normal bilaterally.   DIAGNOSTIC DATA (LABS, IMAGING, TESTING) - I reviewed patient records, labs, notes, testing and imaging myself where available.     ASSESSMENT AND PLAN 67 y.o. year old female  has a past medical history of Anxiety disorder, Fibromyalgia (09/14/2016), Glaucoma, IBS (irritable bowel syndrome), Low iron, Migraine headache, and Myalgia and myositis, unspecified. here with:  1.  Migraine headaches 2.  Fibromyalgia 3.  Muscle cramps  The patient reports more muscle cramps and muscle aches over the last several weeks.  I will check blood work today to make sure there is no reversible causes.  The patient is encouraged to continue using tizanidine as needed.  She will follow-up in 6 months or sooner if needed.    Ward Givens, MSN, NP-C 09/25/2017, 10:36 AM [facility] [address] [place], [place] [address] [phone]

## 2017-09-25 NOTE — Patient Instructions (Signed)
Your Plan:  Continue Tizanidine Blood work today If your symptoms worsen or you develop new symptoms please let us know.    Thank you for coming to see [place] at [facility]. I hope we have been able to provide you high quality care today.  You may receive a patient satisfaction survey over the next few weeks. We would appreciate your feedback and comments so that we may continue to improve ourselves and the health of our patients.

## 2017-09-26 LAB — CBC WITH DIFFERENTIAL/PLATELET
BASOS: 1 %
Basophils Absolute: 0.1 10*3/uL (ref 0.0–0.2)
EOS (ABSOLUTE): 0.1 10*3/uL (ref 0.0–0.4)
EOS: 2 %
HEMATOCRIT: 43.7 % (ref 34.0–46.6)
HEMOGLOBIN: 14.3 g/dL (ref 11.1–15.9)
IMMATURE GRANS (ABS): 0 10*3/uL (ref 0.0–0.1)
Immature Granulocytes: 0 %
LYMPHS ABS: 1.9 10*3/uL (ref 0.7–3.1)
Lymphs: 34 %
MCH: 29.7 pg (ref 26.6–33.0)
MCHC: 32.7 g/dL (ref 31.5–35.7)
MCV: 91 fL (ref 79–97)
MONOCYTES: 7 %
Monocytes Absolute: 0.4 10*3/uL (ref 0.1–0.9)
NEUTROS ABS: 3.2 10*3/uL (ref 1.4–7.0)
Neutrophils: 56 %
Platelets: 279 10*3/uL (ref 150–450)
RBC: 4.82 x10E6/uL (ref 3.77–5.28)
RDW: 14.1 % (ref 12.3–15.4)
WBC: 5.6 10*3/uL (ref 3.4–10.8)

## 2017-09-26 LAB — COMPREHENSIVE METABOLIC PANEL
ALBUMIN: 4.4 g/dL (ref 3.6–4.8)
ALT: 31 IU/L (ref 0–32)
AST: 25 IU/L (ref 0–40)
Albumin/Globulin Ratio: 2.1 (ref 1.2–2.2)
Alkaline Phosphatase: 76 IU/L (ref 39–117)
BILIRUBIN TOTAL: 0.5 mg/dL (ref 0.0–1.2)
BUN / CREAT RATIO: 14 (ref 12–28)
BUN: 10 mg/dL (ref 8–27)
CALCIUM: 9.4 mg/dL (ref 8.7–10.3)
CO2: 25 mmol/L (ref 20–29)
CREATININE: 0.72 mg/dL (ref 0.57–1.00)
Chloride: 102 mmol/L (ref 96–106)
GFR, EST AFRICAN AMERICAN: 100 mL/min/{1.73_m2} (ref >59)
GFR, EST NON AFRICAN AMERICAN: 87 mL/min/{1.73_m2} (ref >59)
Globulin, Total: 2.1 g/dL (ref 1.5–4.5)
Glucose: 82 mg/dL (ref 65–99)
Potassium: 4.5 mmol/L (ref 3.5–5.2)
Sodium: 142 mmol/L (ref 134–144)
TOTAL PROTEIN: 6.5 g/dL (ref 6.0–8.5)

## 2017-09-26 LAB — CK TOTAL AND CKMB (NOT AT ARMC)
CK MB INDEX: 1.6 ng/mL (ref 0.0–5.3)
Total CK: 66 U/L (ref 24–173)

## 2017-09-26 LAB — MAGNESIUM: Magnesium: 2.4 mg/dL — ABNORMAL HIGH (ref 1.6–2.3)

## 2017-09-27 ENCOUNTER — Telehealth: Payer: Self-pay | Admitting: *Deleted

## 2017-09-27 NOTE — Telephone Encounter (Signed)
Spoke with patient and informed her that her lab work is unremarkable. She asked if results would be forwarded to Dr Keturah Barre Inda Merlin, [facility] physicians. This RN stated not routinely but would be glad to do that . The patient stated if everything was within normal limits, she would let her PCP know. This RN explained the only lab slightly above normal is her magnesium.  She verbalized understanding, appreciation of call.

## 2017-10-09 DIAGNOSIS — F411 Generalized anxiety disorder: Secondary | ICD-10-CM | POA: Diagnosis not present

## 2017-10-26 DIAGNOSIS — L708 Other acne: Secondary | ICD-10-CM | POA: Diagnosis not present

## 2017-10-26 DIAGNOSIS — Z85828 Personal history of other malignant neoplasm of skin: Secondary | ICD-10-CM | POA: Diagnosis not present

## 2017-10-26 DIAGNOSIS — D225 Melanocytic nevi of trunk: Secondary | ICD-10-CM | POA: Diagnosis not present

## 2017-10-26 DIAGNOSIS — D2230 Melanocytic nevi of unspecified part of face: Secondary | ICD-10-CM | POA: Diagnosis not present

## 2017-11-21 DIAGNOSIS — H401231 Low-tension glaucoma, bilateral, mild stage: Secondary | ICD-10-CM | POA: Diagnosis not present

## 2017-11-28 DIAGNOSIS — Z1231 Encounter for screening mammogram for malignant neoplasm of breast: Secondary | ICD-10-CM | POA: Diagnosis not present

## 2017-11-30 DIAGNOSIS — F411 Generalized anxiety disorder: Secondary | ICD-10-CM | POA: Diagnosis not present

## 2017-12-12 DIAGNOSIS — Z23 Encounter for immunization: Secondary | ICD-10-CM | POA: Diagnosis not present

## 2017-12-18 DIAGNOSIS — R319 Hematuria, unspecified: Secondary | ICD-10-CM | POA: Diagnosis not present

## 2017-12-25 DIAGNOSIS — R319 Hematuria, unspecified: Secondary | ICD-10-CM | POA: Diagnosis not present

## 2017-12-25 DIAGNOSIS — Z0000 Encounter for general adult medical examination without abnormal findings: Secondary | ICD-10-CM | POA: Diagnosis not present

## 2017-12-25 DIAGNOSIS — R7303 Prediabetes: Secondary | ICD-10-CM | POA: Diagnosis not present

## 2017-12-25 DIAGNOSIS — E7800 Pure hypercholesterolemia, unspecified: Secondary | ICD-10-CM | POA: Diagnosis not present

## 2017-12-25 DIAGNOSIS — M810 Age-related osteoporosis without current pathological fracture: Secondary | ICD-10-CM | POA: Diagnosis not present

## 2017-12-28 DIAGNOSIS — F411 Generalized anxiety disorder: Secondary | ICD-10-CM | POA: Diagnosis not present

## 2018-01-10 ENCOUNTER — Ambulatory Visit (HOSPITAL_COMMUNITY): Payer: Self-pay | Admitting: Psychiatry

## 2018-01-16 DIAGNOSIS — R635 Abnormal weight gain: Secondary | ICD-10-CM | POA: Diagnosis not present

## 2018-01-16 DIAGNOSIS — Z86010 Personal history of colonic polyps: Secondary | ICD-10-CM | POA: Diagnosis not present

## 2018-01-16 DIAGNOSIS — K5904 Chronic idiopathic constipation: Secondary | ICD-10-CM | POA: Diagnosis not present

## 2018-01-17 DIAGNOSIS — R310 Gross hematuria: Secondary | ICD-10-CM | POA: Diagnosis not present

## 2018-01-17 DIAGNOSIS — R350 Frequency of micturition: Secondary | ICD-10-CM | POA: Diagnosis not present

## 2018-01-22 DIAGNOSIS — F411 Generalized anxiety disorder: Secondary | ICD-10-CM | POA: Diagnosis not present

## 2018-01-26 DIAGNOSIS — R310 Gross hematuria: Secondary | ICD-10-CM | POA: Diagnosis not present

## 2018-01-26 DIAGNOSIS — N289 Disorder of kidney and ureter, unspecified: Secondary | ICD-10-CM | POA: Diagnosis not present

## 2018-02-05 DIAGNOSIS — R310 Gross hematuria: Secondary | ICD-10-CM | POA: Diagnosis not present

## 2018-02-05 DIAGNOSIS — N201 Calculus of ureter: Secondary | ICD-10-CM | POA: Diagnosis not present

## 2018-02-14 ENCOUNTER — Ambulatory Visit (HOSPITAL_COMMUNITY): Payer: BLUE CROSS/BLUE SHIELD | Admitting: Psychiatry

## 2018-02-22 DIAGNOSIS — F411 Generalized anxiety disorder: Secondary | ICD-10-CM | POA: Diagnosis not present

## 2018-04-16 ENCOUNTER — Ambulatory Visit: Payer: BLUE CROSS/BLUE SHIELD | Admitting: Adult Health

## 2018-06-18 ENCOUNTER — Telehealth: Payer: Self-pay

## 2018-06-18 NOTE — Telephone Encounter (Signed)
I contacted the pt and left a vm. I advised I was trying to reach her in regards to her 07/02/18 schedule with MM,Np. MM will still be on maternity leave, if pt calls back please offer video visit with Dr. Jannifer Franklin.

## 2018-06-20 ENCOUNTER — Telehealth: Payer: Self-pay | Admitting: Neurology

## 2018-06-20 NOTE — Telephone Encounter (Signed)
This is a Willis/Megan NP patient who is on the schedule to see Jinny Blossom while she is on maternity leave. This patient can be called and rescheduled for virtual visit for Sarah NP.

## 2018-06-20 NOTE — Telephone Encounter (Signed)
Called patient to bring her in sooner for virtual visit with sarah . Patient  Wants me to call her back next Wednesday because she has a lot going on at this time.

## 2018-06-28 ENCOUNTER — Telehealth: Payer: Self-pay | Admitting: Neurology

## 2018-06-28 NOTE — Telephone Encounter (Signed)
Pt would like to know if Webex is compatible to Windows 10. Pt is ok consenting to a Virtual Visit but does not want her computer to crash. Please advise.

## 2018-06-28 NOTE — Telephone Encounter (Signed)
Spoke with the patient and she has given verbal consent to do a webex visit and to file her insurance. She has been added to [employer] webex calendar. E-mail has been confirmed and sent.

## 2018-07-02 ENCOUNTER — Ambulatory Visit: Payer: BLUE CROSS/BLUE SHIELD | Admitting: Adult Health

## 2018-07-02 ENCOUNTER — Encounter

## 2018-07-03 ENCOUNTER — Ambulatory Visit (INDEPENDENT_AMBULATORY_CARE_PROVIDER_SITE_OTHER): Payer: Medicare Other | Admitting: Neurology

## 2018-07-03 ENCOUNTER — Other Ambulatory Visit: Payer: Self-pay

## 2018-07-03 ENCOUNTER — Encounter: Payer: Self-pay | Admitting: Neurology

## 2018-07-03 DIAGNOSIS — M797 Fibromyalgia: Secondary | ICD-10-CM | POA: Diagnosis not present

## 2018-07-03 DIAGNOSIS — G43009 Migraine without aura, not intractable, without status migrainosus: Secondary | ICD-10-CM | POA: Diagnosis not present

## 2018-07-03 DIAGNOSIS — M791 Myalgia, unspecified site: Secondary | ICD-10-CM | POA: Diagnosis not present

## 2018-07-03 MED ORDER — TIZANIDINE HCL 2 MG PO TABS: 2.0000 mg | ORAL_TABLET | Freq: Four times a day (QID) | ORAL | 3 refills | Status: DC | PRN

## 2018-07-03 NOTE — Progress Notes (Signed)
I have read the note, and I agree with the clinical assessment and plan.  Brenda Smith   

## 2018-07-03 NOTE — Progress Notes (Signed)
Virtual Visit via Video Note  I connected with Brenda Smith on 07/03/18 at  8:45 AM EDT by a video enabled telemedicine application and verified that I am speaking with the correct person using two identifiers.   I discussed the limitations of evaluation and management by telemedicine and the availability of in person appointments. The patient expressed understanding and agreed to proceed.  History of Present Illness: 07/03/2018 SS: Brenda Smith is a 68 year old female with history of migraine headaches, fibromyalgia, and irritable bowel syndrome. In the past she has been unable to tolerate several medications including, gabapentin, topamax, cymbalta, nortriptyline, Savella, Lyrica.   She has muscle cramps and muscle pain, taking Zanaflex 2 mg tablets by mouth every 6 hours as needed. She reports there has been no change in her current situation.  She reports she has not had a migraine headache in 4 years.  She says she does work with Dr. Justin Mend, keeps hydrocodone on hand just in case for a bad migraine headache.  She does see Dr. Collene Mares for her IBS is taking Lizess.  She reports she is learning to deal with her fibromyalgia.  She is currently doing yoga, acupuncture, daily exercise, staying hydrated.  She reports she has noticed when she does physical activity or has stress in her life, her muscle spasms and cramps are worse.  Sometimes the cramps could last for a few days.  She reports she did have a kidney stone in November 2019.  09/25/2017 MM: Brenda Smith is a 68 year old female with a history of migraine headaches, fibromyalgia and irritable bowel syndrome.  She returns today for follow-up.  She states that she 3-4 headaches a month.  She states on occasion she will wake up with a sharp shooting pain in the occipital region.  This typically only last a half an hour to an hour.  She states that she can deep breathe and the headache typically resolves fairly quickly.  She states with  Linzess her  nerve pain has improved.  But she has noticed more muscle cramps and muscle pain.  She states that she has an episode about every 2 weeks.  She reports that she typically can take tizanidine and her symptoms improved.  She reports that she is staying hydrated.  She also does stretching and yoga daily.  She returns today for evaluation   Observations/Objective: Alert, answers questions appropriately, speech is clear and concise, excellent knowledge of health condition  Had to convert to telephone visit, did not have face time, Web Ex was not working for the patient, despite trouble shooting  Assessment and Plan: 1.  Migraine headaches 2.  Fibromyalgia 3.  Muscle cramps  Overall she reports she is doing quite well.  She has not had a migraine headache in about 4 years.  She is managing her fibromyalgia by doing acupuncture, stretching, yoga, exercise, staying well-hydrated.  She has noticed that she will have more muscle cramps/spasms during physical activity or times of stress.  We discussed the importance of staying hydrated with water, ensuring adequate nutrition before times of physical activity.  We also discussed that she may try to take a Zanaflex before activities of physical exertion to prevent muscle spasms/cramps, see if that offers her any benefit.  At this time she feels that her holistic methods are beneficial, we will continue to pursue this avenue.  She has been very sensitive to medications in the past.  I did send in a refill for her Zanaflex.  Follow Up Instructions: 6 months for revisit   I discussed the assessment and treatment plan with the patient. The patient was provided an opportunity to ask questions and all were answered. The patient agreed with the plan and demonstrated an understanding of the instructions.   The patient was advised to call back or seek an in-person evaluation if the symptoms worsen or if the condition fails to improve as anticipated.  I provided 25  minutes of non-face-to-face time during this encounter.   Evangeline Dakin, DNP  [facility] [address] [place], [place] [address] [phone]

## 2018-07-13 ENCOUNTER — Other Ambulatory Visit: Payer: Self-pay | Admitting: Neurology

## 2018-07-23 ENCOUNTER — Telehealth: Payer: Self-pay | Admitting: *Deleted

## 2018-07-23 NOTE — Telephone Encounter (Signed)
LMVM for pt to return call to schedule follow up appointment. When returns call please make appointment.    

## 2018-08-10 ENCOUNTER — Other Ambulatory Visit: Payer: Self-pay

## 2018-08-10 ENCOUNTER — Encounter: Payer: Self-pay | Admitting: Podiatry

## 2018-08-10 ENCOUNTER — Ambulatory Visit: Payer: Medicare Other | Admitting: Podiatry

## 2018-08-10 VITALS — BP 107/67 | Temp 97.7°F

## 2018-08-10 DIAGNOSIS — L84 Corns and callosities: Secondary | ICD-10-CM | POA: Diagnosis not present

## 2018-08-10 DIAGNOSIS — M2010 Hallux valgus (acquired), unspecified foot: Secondary | ICD-10-CM | POA: Diagnosis not present

## 2018-08-10 NOTE — Progress Notes (Signed)
This patient presents the office with chief complaint of a callus on the side of her big toe left foot.  She says that it has become increasingly painful walking and wearing her shoes.  She says she was seen by her medical doctor who recommended pads to be placed between her toes and referred to this office.  She presents the office today for an evaluation and treatment of the painful corn on the left big toe.  Vascular  Dorsalis pedis and posterior tibial pulses are palpable  B/L.  Capillary return  WNL.  Temperature gradient is  WNL.  Skin turgor  WNL  Sensorium  Senn Weinstein monofilament wire  WNL. Normal tactile sensation.  Nail Exam  Patient has normal nails with no evidence of bacterial or fungal infection.  Orthopedic  Exam  Muscle tone and muscle strength  WNL.  No limitations of motion feet  B/L.  No crepitus or joint effusion noted.  Foot type is unremarkable and digits show no abnormalities.  HAV  B/L.  Skin  No open lesions.  Normal skin texture and turgor. Corn and pain noted lateral aspect of left hallux.  Corn secondary to HAV left foot  IE.  Discussed this condition with this patient.  Told her that the corn is due to the bunion formation of the big toe joint left foot.  Told her that her activity is causing this pain to occur.  Debridement of the corn and padding was dispensed for her to wear over her left big toe.  Discussed treatment such as injection therapy and surgery for the reduction in pain.  RTC prn.  Gardiner Barefoot DPM

## 2018-09-04 ENCOUNTER — Other Ambulatory Visit: Payer: Self-pay | Admitting: Neurology

## 2018-10-19 ENCOUNTER — Other Ambulatory Visit: Payer: Self-pay | Admitting: Neurology

## 2018-11-01 ENCOUNTER — Other Ambulatory Visit: Payer: Self-pay | Admitting: Neurology

## 2018-12-07 ENCOUNTER — Other Ambulatory Visit: Payer: Self-pay | Admitting: Family Medicine

## 2018-12-07 DIAGNOSIS — M5440 Lumbago with sciatica, unspecified side: Secondary | ICD-10-CM

## 2018-12-25 ENCOUNTER — Ambulatory Visit
Admission: RE | Admit: 2018-12-25 | Discharge: 2018-12-25 | Disposition: A | Discharge status: Home / Self Care | Payer: Medicare Other | Source: Ambulatory Visit | Attending: Family Medicine | Admitting: Family Medicine

## 2018-12-25 ENCOUNTER — Other Ambulatory Visit: Payer: Self-pay

## 2018-12-25 DIAGNOSIS — M5440 Lumbago with sciatica, unspecified side: Secondary | ICD-10-CM

## 2019-01-06 NOTE — Progress Notes (Signed)
PATIENT: Brenda Smith DOB: [birth year]  REASON FOR VISIT: follow up HISTORY FROM: patient  HISTORY OF PRESENT ILLNESS: Today 01/07/19  Brenda Smith is a 68 year old female with history of migraine headaches, fibromyalgia, and irritable bowel syndrome."  She has been unable to tolerate several medications including gabapentin, Topamax, Cymbalta, nortriptyline, Savella, and Lyrica.  She reports her headaches continue to be under excellent control.  She remains on tizanidine 2 mg every 6 hours as needed.  She does not take the medication on a daily basis.  She has noticed that physical activity will trigger muscle spasms and pain in her legs.  She continues to be quite active doing housekeeping, yard work, yoga, stretching, and walking.  She is pleased her gym has since opened back up. She is eager for her church activities to resume. She does report she has gained about 10 pounds in the last year, and notices that she has leg swelling.  She is seeing a new primary doctor, Dr. Justin Mend.  A few weeks ago she developed low back pain, MRI of the lumbar spine was completed that showed lumbar spondylosis and degenerative disc disease causing mild left foraminal stenosis at L4-5.  She is now taking meloxicam.  She presents today for follow-up unaccompanied.  HISTORY 07/03/2018 SS: Brenda Smith is a 68 year old female with history of migraine headaches, fibromyalgia, and irritable bowel syndrome. In the past she has been unable to tolerate several medications including, gabapentin, topamax, cymbalta, nortriptyline, Savella, Lyrica.   She has muscle cramps and muscle pain, taking Zanaflex 2 mg tablets by mouth every 6 hours as needed. She reports there has been no change in her current situation.  She reports she has not had a migraine headache in 4 years.  She says she does work with Dr. Justin Mend, keeps hydrocodone on hand just in case for a bad migraine headache.  She does see Dr. Collene Mares for her IBS is taking  Lizess.  She reports she is learning to deal with her fibromyalgia.  She is currently doing yoga, acupuncture, daily exercise, staying hydrated.  She reports she has noticed when she does physical activity or has stress in her life, her muscle spasms and cramps are worse.  Sometimes the cramps could last for a few days.  She reports she did have a kidney stone in November 2019.  REVIEW OF SYSTEMS: Out of a complete 14 system review of symptoms, the patient complains only of the following symptoms, and all other reviewed systems are negative.  Insomnia, frequent waking, back pain, aching muscles, muscle cramps, neck pain, anxious, unexpected weight change  ALLERGIES: Allergies  Allergen Reactions   Penicillins Anaphylaxis and Hives    Mouth becomes swollen, trouble swallowing   Savella [Milnacipran Hcl] Other (See Comments)    "anxiety, constipation"   Pamelor [Nortriptyline Hcl]    Topamax [Topiramate]     HOME MEDICATIONS: Outpatient Medications Prior to Visit  Medication Sig Dispense Refill   brimonidine (ALPHAGAN P) 0.1 % SOLN Place 1 drop into both eyes 2 (two) times daily.     HYDROcodone-acetaminophen (NORCO) 10-325 MG per tablet Take 1 tablet by mouth every 6 (six) hours as needed for pain.     linaclotide (LINZESS) 72 MCG capsule Take 72 mcg by mouth daily before breakfast.     loratadine (CLARITIN) 10 MG tablet Take 10 mg by mouth.     LORazepam (ATIVAN) 0.5 MG tablet Take 0.5 mg by mouth 2 (two) times daily.  meloxicam (MOBIC) 15 MG tablet Take 15 mg by mouth daily.     PROAIR HFA 108 (90 Base) MCG/ACT inhaler INHALE 1 PUFF EVERY 4 HOURS AS NEEDED FOR WHEEZING/SHORTNESS OF BREATH  5   tiZANidine (ZANAFLEX) 2 MG tablet TAKE 1 TABLET (2 MG TOTAL) BY MOUTH EVERY 6 (SIX) HOURS AS NEEDED FOR MUSCLE SPASMS. 90 tablet 3   UNABLE TO FIND Med Name: hycosamine 0.125 as needed     fluticasone (FLONASE) 50 MCG/ACT nasal spray      LORazepam (ATIVAN) 1 MG tablet Take 0.5  mg by mouth as needed. 1 1/2 tablet daily     tamsulosin (FLOMAX) 0.4 MG CAPS capsule Take 0.4 mg by mouth daily.     No facility-administered medications prior to visit.     PAST MEDICAL HISTORY: Past Medical History:  Diagnosis Date   Anxiety disorder    Fibromyalgia 09/14/2016   Glaucoma    IBS (irritable bowel syndrome)    Low iron    Migraine headache    Myalgia and myositis 07/18/2012   Myalgia and myositis, unspecified     PAST SURGICAL HISTORY: Past Surgical History:  Procedure Laterality Date   MOHS SURGERY     Squamous cell   TONSILLECTOMY      FAMILY HISTORY: Family History  Problem Relation Age of Onset   Heart attack Mother    Cancer Mother    Heart attack Father    Gout Father    Anxiety disorder Father    Heart disease Brother    Headache Maternal Grandmother    Heart disease Maternal Grandmother     SOCIAL HISTORY: Social History   Socioeconomic History   Marital status: Married    Spouse name: Not on file   Number of children: 0   Years of education: PHD   Highest education level: Not on file  Occupational History   Not on file  Social Needs   Financial resource strain: Not on file   Food insecurity    Worry: Not on file    Inability: Not on file   Transportation needs    Medical: Not on file    Non-medical: Not on file  Tobacco Use   Smoking status: Never Smoker   Smokeless tobacco: Never Used  Substance and Sexual Activity   Alcohol use: Yes    Comment: Drinks wine on occasion   Drug use: No   Sexual activity: Yes    Partners: Male    Birth control/protection: None  Lifestyle   Physical activity    Days per week: Not on file    Minutes per session: Not on file   Stress: Not on file  Relationships   Social connections    Talks on phone: Not on file    Gets together: Not on file    Attends religious service: Not on file    Active member of club or organization: Not on file    Attends  meetings of clubs or organizations: Not on file    Relationship status: Not on file   Intimate partner violence    Fear of current or ex partner: Not on file    Emotionally abused: Not on file    Physically abused: Not on file    Forced sexual activity: Not on file  Other Topics Concern   Not on file  Social History Narrative   Patient is married, no children.   Patient is right handed.   Patient has her PhD.  Patient drinks decaffeinated tea.      PHYSICAL EXAM  Vitals:   01/07/19 1028  BP: 132/83  Pulse: (!) 48  Temp: 98.2 F (36.8 C)  TempSrc: Oral  Weight: 146 lb 12.8 oz (66.6 kg)  Height: 5\' 3"  (1.6 m)   Body mass index is 26 kg/m.  Generalized: Well developed, in no acute distress   Neurological examination  Mentation: Alert oriented to time, place, history taking. Follows all commands speech and language fluent Cranial nerve II-XII: Pupils were equal round reactive to light. Extraocular movements were full, visual field were full on confrontational test. Facial sensation and strength were normal. Head turning and shoulder shrug  were normal and symmetric. Motor: The motor testing reveals 5 over 5 strength of all 4 extremities. Good symmetric motor tone is noted throughout.  Sensory: Sensory testing is intact to soft touch on all 4 extremities. No evidence of extinction is noted.  Coordination: Cerebellar testing reveals good finger-nose-finger and heel-to-shin bilaterally.  Gait and station: Gait is normal. Tandem gait is normal.  Reflexes: Deep tendon reflexes are symmetric and normal bilaterally.   DIAGNOSTIC DATA (LABS, IMAGING, TESTING) - I reviewed patient records, labs, notes, testing and imaging myself where available.  Lab Results  Component Value Date   WBC 5.6 09/25/2017   HGB 14.3 09/25/2017   HCT 43.7 09/25/2017   MCV 91 09/25/2017   PLT 279 09/25/2017      Component Value Date/Time   NA 142 09/25/2017 1142   K 4.5 09/25/2017 1142   CL  102 09/25/2017 1142   CO2 25 09/25/2017 1142   GLUCOSE 82 09/25/2017 1142   BUN 10 09/25/2017 1142   CREATININE 0.72 09/25/2017 1142   CALCIUM 9.4 09/25/2017 1142   PROT 6.5 09/25/2017 1142   ALBUMIN 4.4 09/25/2017 1142   AST 25 09/25/2017 1142   ALT 31 09/25/2017 1142   ALKPHOS 76 09/25/2017 1142   BILITOT 0.5 09/25/2017 1142   GFRNONAA 87 09/25/2017 1142   GFRAA 100 09/25/2017 1142   No results found for: CHOL, HDL, LDLCALC, LDLDIRECT, TRIG, CHOLHDL No results found for: HGBA1C No results found for: VITAMINB12 No results found for: TSH   ASSESSMENT AND PLAN 68 y.o. year old female  has a past medical history of Anxiety disorder, Fibromyalgia (09/14/2016), Glaucoma, IBS (irritable bowel syndrome), Low iron, Migraine headache, Myalgia and myositis (07/18/2012), and Myalgia and myositis, unspecified. here with:  1.  Migraine headaches 2.  Fibromyalgia 3.  Muscle cramps  Overall, she has continued to do well.  Her headaches are under excellent control.  She is not taking tizanidine on a daily basis.  I have encouraged her to try to take 1/2 tablet or a full tablet before exercise or before physical activity to get ahead of the muscle spasms.  I have encouraged her to continue exercise, stretching, yoga, and staying hydrated with water.  She will discuss with her primary doctor about her weight gain and swelling in her legs.  She will follow-up in this office in 8 months or sooner if needed.  I did advise that if her symptoms worsen or she develops any new symptoms she should let us know.  I spent 15 minutes with the patient. 50% of this time was spent discussing her plan of care.  Butler Denmark, AGNP-C, DNP 01/07/2019, 10:44 AM [facility] [address] [place], [place] [address] [phone]

## 2019-01-07 ENCOUNTER — Other Ambulatory Visit: Payer: Self-pay

## 2019-01-07 ENCOUNTER — Encounter: Payer: Self-pay | Admitting: Neurology

## 2019-01-07 ENCOUNTER — Ambulatory Visit: Payer: Medicare Other | Admitting: Neurology

## 2019-01-07 VITALS — BP 132/83 | HR 48 | Temp 98.2°F | Ht 63.0 in | Wt 146.8 lb

## 2019-01-07 DIAGNOSIS — M797 Fibromyalgia: Secondary | ICD-10-CM

## 2019-01-07 MED ORDER — TIZANIDINE HCL 2 MG PO TABS: 2.0000 mg | ORAL_TABLET | Freq: Four times a day (QID) | ORAL | 3 refills | Status: DC | PRN

## 2019-01-07 NOTE — Patient Instructions (Signed)
You may continue tizanidine as needed for muscle pain. Try to take a tablet or 1/2 tablet before activity that is provoking. Please continue walking, stretching, and Yoga. Please follow-up with your primary doctor in regards to your weight gain and leg swelling,

## 2019-01-08 NOTE — Progress Notes (Signed)
I have read the note, and I agree with the clinical assessment and plan.  Chaquita Basques K Sevag Shearn   

## 2019-01-16 ENCOUNTER — Other Ambulatory Visit: Payer: Self-pay

## 2019-01-16 ENCOUNTER — Encounter: Payer: Self-pay | Admitting: Physical Therapy

## 2019-01-16 ENCOUNTER — Ambulatory Visit: Payer: Medicare Other | Attending: Family Medicine | Admitting: Physical Therapy

## 2019-01-16 DIAGNOSIS — M545 Low back pain: Secondary | ICD-10-CM | POA: Diagnosis not present

## 2019-01-16 DIAGNOSIS — R252 Cramp and spasm: Secondary | ICD-10-CM | POA: Diagnosis present

## 2019-01-16 DIAGNOSIS — M6281 Muscle weakness (generalized): Secondary | ICD-10-CM

## 2019-01-16 DIAGNOSIS — G8929 Other chronic pain: Secondary | ICD-10-CM | POA: Diagnosis present

## 2019-01-16 NOTE — Therapy (Signed)
[facility] [address] [place], [place], [address] Phone: [phone]   Fax:  [phone]  Physical Therapy Evaluation  Patient Details  Name: Brenda Smith MRN: [id number] Date of Birth: Aug 14, 1950 Referring Provider (PT): Dr. Maurice Small   Encounter Date: 01/16/2019  PT End of Session - 01/16/19 1440    Visit Number  1    Date for PT Re-Evaluation  03/13/19    Authorization Type  medicare    PT Start Time  1410    PT Stop Time  1441    PT Time Calculation (min)  31 min    Activity Tolerance  Patient tolerated treatment well    Behavior During Therapy  [facility] for tasks assessed/performed       Past Medical History:  Diagnosis Date  . Anxiety disorder   . Fibromyalgia 09/14/2016  . Glaucoma   . IBS (irritable bowel syndrome)   . Low iron   . Migraine headache   . Myalgia and myositis 07/18/2012  . Myalgia and myositis, unspecified     Past Surgical History:  Procedure Laterality Date  . MOHS SURGERY     Squamous cell  . TONSILLECTOMY      There were no vitals filed for this visit.   Subjective Assessment - 01/16/19 1411    Subjective  Patient has had fibromyalgia for 3 years and most pain is in her back. Patient pain will radiated into hips and groin. Meloxicam has taking the heat out of the muscle.    Patient Stated Goals  cross legs and go up and down stairs    Currently in Pain?  Yes    Pain Score  6    low 2/10   Pain Location  Back    Pain Orientation  Right;Left;Lower    Pain Descriptors / Indicators  Aching;Tingling;Burning;Spasm    Pain Type  Chronic pain    Pain Onset  More than a month ago    Pain Frequency  Constant    Aggravating Factors   walking, after exercise    Pain Relieving Factors  yoga, stretches         [facility] PT Assessment - 01/16/19 0001      Assessment   Medical Diagnosis  M54.30 Sciatica, unsepecified laterality    Referring Provider (PT)  Dr. Maurice Small    Onset Date/Surgical Date  --   chronic, past 3 years   Prior Therapy  none      Precautions   Precautions  Other (comment)    Precaution Comments  Breast cancer, osteoporosis      Restrictions   Weight Bearing Restrictions  No      Balance Screen   Has the patient fallen in the past 6 months  No    Has the patient had a decrease in activity level because of a fear of falling?   No    Is the patient reluctant to leave their home because of a fear of falling?   No      Home Film/video editor residence      Prior Function   Level of Independence  Independent    Leisure  yoga daily, walk every other day      Cognition   Overall Cognitive Status  Within Functional Limits for tasks assessed      Observation/Other Assessments   Focus on Therapeutic Outcomes (FOTO)   48% limitation; goal is 41% limited  Posture/Postural Control   Posture/Postural Control  No significant limitations      ROM / Strength   AROM / PROM / Strength  AROM;PROM;Strength      AROM   Lumbar Extension  decreased by 25%      Strength   Right Hip Extension  4/5    Right Hip ABduction  3+/5    Left Hip Extension  4/5    Left Hip ABduction  3+/5    Right Knee Flexion  4/5    Right Knee Extension  4+/5    Left Knee Flexion  4/5    Left Knee Extension  4+/5      Palpation   Palpation comment  tenderness located in lumbar, gluteals, piriformis, Tensor fascia lata                Objective measurements completed on examination: See above findings.              PT Education - 01/16/19 1437    Education Details  instruction on dry needling    Person(s) Educated  Patient    Methods  Explanation;Handout    Comprehension  Verbalized understanding       PT Short Term Goals - 01/16/19 1711      PT SHORT TERM GOAL #1   Title  independent with initial HEP    Time  4    Period  Weeks    Status  New    Target Date  02/13/19      PT SHORT TERM GOAL #2    Title  able to go up and down stairs with >/= 50% greater ease due to improved flexibility    Time  4    Period  Weeks    Status  New    Target Date  02/13/19      PT SHORT TERM GOAL #3   Title  able to cross her legs with >/= 25% greater ease due to reduction of pain    Time  4    Period  Weeks    Status  New    Target Date  02/13/19        PT Long Term Goals - 01/16/19 1431      PT LONG TERM GOAL #1   Title  independent with advanced HEP    Time  8    Period  Weeks    Status  New    Target Date  03/13/19      PT LONG TERM GOAL #2   Title  [place] >/= 70[place]    Time  8    Period  Weeks    Status  New    Target Date  03/13/19      PT LONG TERM GOAL #3   Title  cross her legs with greater ease due to improved flexibility in her leg muscles and with >/= 50% less discomfort    Time  8    Period  Weeks    Status  New    Target Date  03/13/19      PT LONG TERM GOAL #4   Title  lift grocery bags with correct body mechanics to reduce the strain on her back and legs    Time  8    Period  Weeks    Status  New    Target Date  03/13/19      PT LONG  TERM GOAL #5   Title  FOTO score </= 41 % limitation    Time  8    Period  Weeks    Status  New    Target Date  03/13/19             Plan - 01/16/19 1442    Clinical Impression Statement  Patient is a 68 year old with chronic back pain and sciatica for the past 3 years with sudden onset. Patient has  a history of fibromyalgia. Patient reports her pain is constant ranging from 2/10-6/10. Patient pain is increased with going up and down stairs, activity, carrying things. Patient has weakness in bilateral lower extremities. Patien tlumbar extension decreased by 25%. Patient has tenderness located in lumbar paraspinals, bilateral gluteal, ITB and tensor fascia lata. Patient was instructed on what dry needling was for her future appointment. Patient would benefit from  skilled therapy to work on elongation of her muscles, proer body mechanics to reduce strain on her body and improve overall strength.    Personal Factors and Comorbidities  Age;Comorbidity 1;Comorbidity 2;Comorbidity 3+;Time since onset of injury/illness/exacerbation    Comorbidities  Osteoporosis, IBS, Fibromyalgia    Examination-Activity Limitations  Stand;Stairs;Squat;Sit    Examination-Participation Restrictions  Meal Prep;Cleaning;Community Activity;Laundry    Stability/Clinical Decision Making  Evolving/Moderate complexity    Clinical Decision Making  Moderate    Rehab Potential  Good    PT Frequency  1x / week    PT Duration  8 weeks    PT Treatment/Interventions  ADLs/Self Care Home Management;Cryotherapy;Electrical Stimulation;Iontophoresis 4mg /ml Dexamethasone;Moist Heat;Ultrasound;Therapeutic exercise;Therapeutic activities;Stair training;Gait training;Neuromuscular re-education;Patient/family education;Dry needling;Manual techniques    PT Next Visit Plan  stretches for the legs and back, dry needling to lumbar and gluteal, help her understand pain, modalities as needed    Consulted and Agree with Plan of Care  Patient       Patient will benefit from skilled therapeutic intervention in order to improve the following deficits and impairments:  Increased fascial restricitons, Increased muscle spasms, Pain, Decreased strength, Impaired flexibility  Visit Diagnosis: Chronic bilateral low back pain, unspecified whether sciatica present - Plan: PT plan of care cert/re-cert  Muscle weakness (generalized) - Plan: PT plan of care cert/re-cert  Cramp and spasm - Plan: PT plan of care cert/re-cert     Problem List Patient Active Problem List   Diagnosis Date Noted  . Fibromyalgia 09/14/2016  . 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 Chronic serous otitis media of right ear 12/31/2015  . Myalgia 07/18/2012  .  Pain in limb 12/20/2011  . Migraine without aura 12/20/2011    Earlie Counts, PT 01/16/19 5:15 PM   [facility] [address], [place], [place], [address] Phone: [phone]   Fax:  [phone]  Name: Brenda Smith MRN: [id number] Date of Birth: 1951-02-10

## 2019-01-16 NOTE — Patient Instructions (Signed)

## 2019-01-21 ENCOUNTER — Encounter: Payer: Medicare Other | Admitting: Physical Therapy

## 2019-01-23 ENCOUNTER — Ambulatory Visit: Payer: Medicare Other | Admitting: Physical Therapy

## 2019-01-23 ENCOUNTER — Other Ambulatory Visit: Payer: Self-pay

## 2019-01-23 ENCOUNTER — Encounter: Payer: Self-pay | Admitting: Physical Therapy

## 2019-01-23 DIAGNOSIS — M6281 Muscle weakness (generalized): Secondary | ICD-10-CM

## 2019-01-23 DIAGNOSIS — G8929 Other chronic pain: Secondary | ICD-10-CM

## 2019-01-23 DIAGNOSIS — M545 Low back pain, unspecified: Secondary | ICD-10-CM

## 2019-01-23 DIAGNOSIS — R252 Cramp and spasm: Secondary | ICD-10-CM

## 2019-01-23 NOTE — Therapy (Signed)
[facility] [address] [place], [place], [address] Phone: [phone]   Fax:  [phone]  Physical Therapy Treatment  Patient Details  Name: Brenda Smith MRN: [id number] Date of Birth: [birth year] Referring Provider (PT): Dr. Maurice Small   Encounter Date: 01/23/2019  PT End of Session - 01/23/19 1150    Visit Number  2    Date for PT Re-Evaluation  03/13/19    Authorization Type  medicare    PT Start Time  1150    PT Stop Time  1230    PT Time Calculation (min)  40 min    Activity Tolerance  Patient tolerated treatment well    Behavior During Therapy  [facility] for tasks assessed/performed       Past Medical History:  Diagnosis Date  . Anxiety disorder   . Fibromyalgia 09/14/2016  . Glaucoma   . IBS (irritable bowel syndrome)   . Low iron   . Migraine headache   . Myalgia and myositis 07/18/2012  . Myalgia and myositis, unspecified     Past Surgical History:  Procedure Laterality Date  . MOHS SURGERY     Squamous cell  . TONSILLECTOMY      There were no vitals filed for this visit.  Subjective Assessment - 01/23/19 1152    Currently in Pain?  Yes    Pain Score  3     Pain Location  --   Across low back, hips pelvis   Pain Descriptors / Indicators  Aching    Aggravating Factors   after exercise    Pain Relieving Factors  yoga, stretches    Multiple Pain Sites  No                       [facility] Adult PT Treatment/Exercise - 01/23/19 0001      Lumbar Exercises: Stretches   Single Knee to Chest Stretch  --   Yoga biased SKC Bil 20 sec added to HEP   Other Lumbar Stretch Exercise  [place] hand to big toe with strap Bil  held poses 10-20 sec depending upon the direction   Added to HEP   Other Lumbar Stretch Exercise  Hip ER stretch with pillow support 30 sec bil   added to HEP            PT Education - 01/23/19 1402    Education Details  Neuro science of pain, HEP to  supplement her current home routine. 1. SKC with opposite knee straight and long, 2 Yoga Hand to Big Toe pose with yoga strap 3. Hip ER stretch with thigh propped up on pillow.    Person(s) Educated  Patient    Methods  Explanation;Tactile cues;Verbal cues;Handout    Comprehension  Returned demonstration;Verbalized understanding       PT Short Term Goals - 01/16/19 1711      PT SHORT TERM GOAL #1   Title  independent with initial HEP    Time  4    Period  Weeks    Status  New    Target Date  02/13/19      PT SHORT TERM GOAL #2   Title  able to go up and down stairs with >/= 50% greater ease due to improved flexibility    Time  4    Period  Weeks    Status  New    Target Date  02/13/19      PT SHORT TERM  GOAL #3   Title  able to cross her legs with >/= 25% greater ease due to reduction of pain    Time  4    Period  Weeks    Status  New    Target Date  02/13/19        PT Long Term Goals - 01/16/19 1431      PT LONG TERM GOAL #1   Title  independent with advanced HEP    Time  8    Period  Weeks    Status  New    Target Date  03/13/19      PT LONG TERM GOAL #2   Title  go [place] and [place] with >/= 70%    Time  8    Period  Weeks    Status  New    Target Date  03/13/19      PT LONG TERM GOAL #3   Title  cross her legs with greater ease due to improved flexibility in her leg muscles and with >/= 50% less discomfort    Time  8    Period  Weeks    Status  New    Target Date  03/13/19      PT LONG TERM GOAL #4   Title  lift grocery bags with correct body mechanics to reduce the strain on her back and legs    Time  8    Period  Weeks    Status  New    Target Date  03/13/19      PT LONG TERM GOAL #5   Title  FOTO score </= 41 % limitation    Time  8    Period  Weeks    Status  New    Target Date  03/13/19            Plan - 01/23/19 1204    Clinical Impression Statement  Pt arrives anxious about the PT  session today. We discussed her current home stretching routine and added to that focusing specifically on her hips. Pt did very well with her new stretches, requiring some verbal reminders to relax both her mind and her body. She admitts her anxiety may play a part in her pain. PTA educated pt about neuro science of pain.    Personal Factors and Comorbidities  Age;Comorbidity 1;Comorbidity 2;Comorbidity 3+;Time since onset of injury/illness/exacerbation    Comorbidities  Osteoporosis, IBS, Fibromyalgia    Examination-Activity Limitations  Stand;Stairs;Squat;Sit    Examination-Participation Restrictions  Meal Prep;Cleaning;Community Activity;Laundry    Stability/Clinical Decision Making  Evolving/Moderate complexity    Rehab Potential  Good    PT Frequency  1x / week    PT Duration  8 weeks    PT Treatment/Interventions  ADLs/Self Care Home Management;Cryotherapy;Electrical Stimulation;Iontophoresis 4mg /ml Dexamethasone;Moist Heat;Ultrasound;Therapeutic exercise;Therapeutic activities;Stair training;Gait training;Neuromuscular re-education;Patient/family education;Dry needling;Manual techniques    PT Next Visit Plan  Dry needling next, pt is slightly anxious about it. See how her new stretches went at home. They are in patient instructions NOT [employer].    Consulted and Agree with Plan of Care  --       Patient will benefit from skilled therapeutic intervention in order to improve the following deficits and impairments:  Increased fascial restricitons, Increased muscle spasms, Pain, Decreased strength, Impaired flexibility  Visit Diagnosis: Chronic bilateral low back pain, unspecified whether sciatica present  Muscle weakness (generalized)  Cramp and spasm  Problem List Patient Active Problem List   Diagnosis Date Noted  . Fibromyalgia 09/14/2016  . 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 Chronic serous otitis  media of right ear 12/31/2015  . Myalgia 07/18/2012  . Pain in limb 12/20/2011  . Migraine without aura 12/20/2011    COCHRAN,JENNIFER, PTA 01/23/2019, 2:05 PM  [facility] [address] [place], [place], [address] Phone: [phone]   Fax:  [phone]  Name: Brenda Smith MRN: [id number] Date of Birth: [birth year]

## 2019-01-23 NOTE — Patient Instructions (Signed)
Initial HEP stretches to be added to your current program.   1. Hand to Big Toe pose: This is with the yoga strap. You will start with your hamstrings ( 12:00), the leg to belly button, then leg outwards. You can hold each position 10-20 sec, but really focus on your breathing. Tell your body "this is ok, we are not damaging our tissues."  Do both legs. 1x day   2. Hip rotation stretch with pillow for support: Lay flat on your back, one leg is straight out on the bed, the other knee bends and then drops outwardly resting on a pillow. Stay in this position for 10-30 sec focusing on breathing and creating a heaviness to your bones & muscles. Do both sides. Eventually you may not need the pillow. Do 1x each day.

## 2019-01-30 ENCOUNTER — Encounter: Payer: Self-pay | Admitting: Physical Therapy

## 2019-01-30 ENCOUNTER — Ambulatory Visit: Payer: Medicare Other | Admitting: Physical Therapy

## 2019-01-30 ENCOUNTER — Other Ambulatory Visit: Payer: Self-pay

## 2019-01-30 DIAGNOSIS — M545 Low back pain: Secondary | ICD-10-CM | POA: Diagnosis not present

## 2019-01-30 DIAGNOSIS — G8929 Other chronic pain: Secondary | ICD-10-CM

## 2019-01-30 DIAGNOSIS — R252 Cramp and spasm: Secondary | ICD-10-CM

## 2019-01-30 DIAGNOSIS — M6281 Muscle weakness (generalized): Secondary | ICD-10-CM

## 2019-01-30 NOTE — Therapy (Signed)
[facility] [address] [place], [place], [address] Phone: [phone]   Fax:  [phone]  Physical Therapy Treatment  Patient Details  Name: Brenda Smith MRN: [id number] Date of Birth: Apr 28, 1950 Referring Provider (PT): Dr. Maurice Small   Encounter Date: 01/30/2019  PT End of Session - 01/30/19 1105    Visit Number  3    Date for PT Re-Evaluation  03/13/19    Authorization Type  medicare    PT Start Time  1100    PT Stop Time  1140    PT Time Calculation (min)  40 min    Activity Tolerance  Patient tolerated treatment well    Behavior During Therapy  [facility] for tasks assessed/performed       Past Medical History:  Diagnosis Date  . Anxiety disorder   . Fibromyalgia 09/14/2016  . Glaucoma   . IBS (irritable bowel syndrome)   . Low iron   . Migraine headache   . Myalgia and myositis 07/18/2012  . Myalgia and myositis, unspecified     Past Surgical History:  Procedure Laterality Date  . MOHS SURGERY     Squamous cell  . TONSILLECTOMY      There were no vitals filed for this visit.  Subjective Assessment - 01/30/19 1104    Subjective  Patient reports no changes in the pain right now.    Patient Stated Goals  cross legs and go up and down stairs    Currently in Pain?  Yes    Pain Score  4     Pain Location  --   across low back and pelvis   Pain Orientation  Right;Left;Lower    Pain Descriptors / Indicators  Aching    Pain Type  Chronic pain    Pain Onset  More than a month ago    Pain Frequency  Constant    Aggravating Factors   after exercise    Pain Relieving Factors  yoga, stretches    Multiple Pain Sites  No                       [facility] Adult PT Treatment/Exercise - 01/30/19 0001      Manual Therapy   Manual Therapy  Soft tissue mobilization    Manual therapy comments  manually stretched bilateral hips, using assissitive device to pull the fascia off the muscles to  reduce the fascial pain    Soft tissue mobilization  elongation of the lumbar paraspinals, gluteals, and piriformis to elongate the tissue  after dry needling       Trigger Point Dry Needling - 01/30/19 0001    Consent Given?  Yes    Education Handout Provided  Previously provided    Muscles Treated Back/Hip  Lumbar multifidi;Gluteus minimus;Gluteus medius;Gluteus maximus    Gluteus Minimus Response  Twitch response elicited;Palpable increased muscle length    Gluteus Medius Response  Twitch response elicited;Palpable increased muscle length    Gluteus Maximus Response  Palpable increased muscle length;Twitch response elicited    Lumbar multifidi Response  Twitch response elicited;Palpable increased muscle length             PT Short Term Goals - 01/30/19 1144      PT SHORT TERM GOAL #1   Title  independent with initial HEP    Time  4    Period  Weeks    Status  Achieved  PT Long Term Goals - 01/16/19 1431      PT LONG TERM GOAL #1   Title  independent with advanced HEP    Time  8    Period  Weeks    Status  New    Target Date  03/13/19      PT LONG TERM GOAL #2   Title  [place] >[place]= 70[place]    Time  8    Period  Weeks    Status  New    Target Date  03/13/19      PT LONG TERM GOAL #3   Title  cross her legs with greater ease due to improved flexibility in her leg muscles and with >/= 50% less discomfort    Time  8    Period  Weeks    Status  New    Target Date  03/13/19      PT LONG TERM GOAL #4   Title  lift grocery bags with correct body mechanics to reduce the strain on her back and legs    Time  8    Period  Weeks    Status  New    Target Date  03/13/19      PT LONG TERM GOAL #5   Title  FOTO score </= 41 % limitation    Time  8    Period  Weeks    Status  New    Target Date  03/13/19            Plan - 01/30/19 1106    Clinical Impression Statement  Patient had a hystamine  affect on her skin after using the assistive devic for soft tissue work. The tissue was softer and improved mobility. Patient has not met goals due to just starting therapy. Patient had no increased pain after todays visit. Patient will benefit from skilled therapy to reduce her pain and improve strength.    Personal Factors and Comorbidities  Age;Comorbidity 1;Comorbidity 2;Comorbidity 3+;Time since onset of injury/illness/exacerbation    Comorbidities  Osteoporosis, IBS, Fibromyalgia    Examination-Activity Limitations  Stand;Stairs;Squat;Sit    Examination-Participation Restrictions  Meal Prep;Cleaning;Community Activity;Laundry    Stability/Clinical Decision Making  Evolving/Moderate complexity    Rehab Potential  Good    PT Frequency  1x / week    PT Duration  8 weeks    PT Treatment/Interventions  ADLs/Self Care Home Management;Cryotherapy;Electrical Stimulation;Iontophoresis '4mg'$ /ml Dexamethasone;Moist Heat;Ultrasound;Therapeutic exercise;Therapeutic activities;Stair training;Gait training;Neuromuscular re-education;Patient/family education;Dry needling;Manual techniques    PT Next Visit Plan  assess dry needling, work on trunk isometrics, knee flexion and extension with theraband for strength, hip ROM for crossing her legs    Recommended Other Services  MD signed the initial note    Consulted and Agree with Plan of Care  Patient       Patient will benefit from skilled therapeutic intervention in order to improve the following deficits and impairments:  Increased fascial restricitons, Increased muscle spasms, Pain, Decreased strength, Impaired flexibility  Visit Diagnosis: Chronic bilateral low back pain, unspecified whether sciatica present  Muscle weakness (generalized)  Cramp and spasm     Problem List Patient Active Problem List   Diagnosis Date Noted  . Fibromyalgia 09/14/2016  . Mixed conductive and sensorineural hearing loss of right ear with restricted hearing of left  ear 12/31/2015  . Chronic serous otitis media of right ear 12/31/2015  . Myalgia 07/18/2012  . Pain in  limb 12/20/2011  . Migraine without aura 12/20/2011    Earlie Counts, PT 01/30/19 11:44 AM    [facility] [address], [place], [place], [address] Phone: [phone]   Fax:  [phone]  Name: Brenda Smith MRN: [id number] Date of Birth: 1950/05/16

## 2019-02-05 ENCOUNTER — Encounter: Payer: Self-pay | Admitting: Physical Therapy

## 2019-02-05 ENCOUNTER — Other Ambulatory Visit: Payer: Self-pay

## 2019-02-05 ENCOUNTER — Ambulatory Visit: Payer: Medicare Other | Attending: Family Medicine | Admitting: Physical Therapy

## 2019-02-05 DIAGNOSIS — R252 Cramp and spasm: Secondary | ICD-10-CM

## 2019-02-05 DIAGNOSIS — M545 Low back pain: Secondary | ICD-10-CM | POA: Insufficient documentation

## 2019-02-05 DIAGNOSIS — M6281 Muscle weakness (generalized): Secondary | ICD-10-CM

## 2019-02-05 DIAGNOSIS — G8929 Other chronic pain: Secondary | ICD-10-CM

## 2019-02-05 NOTE — Patient Instructions (Signed)
Access Code: [email or web address]  URL: [email or web address]  Date: 02/05/2019  Prepared by: Earlie Counts   Exercises Supine Transversus Abdominis Bracing - Hands on Stomach - 10 reps - 1 sets - 5 sec hold - 1x daily - 7x weekly Bent Knee Fallouts - 10 reps - 1 sets - 1x daily - 7x weekly Supine Bridge - 10 reps - 1 sets - 5 sec hold - 1x daily - 7x weekly [facility] [address], [place] [address] Phone # [phone] Fax [phone]

## 2019-02-05 NOTE — Therapy (Signed)
[facility] [address] [place], [place], [address] Phone: [phone]   Fax:  [phone]  Physical Therapy Treatment  Patient Details  Name: Brenda Smith MRN: [id number] Date of Birth: [birth year] Referring Provider (PT): Dr. Maurice Small   Encounter Date: 02/05/2019  PT End of Session - 02/05/19 1227    Visit Number  4    Date for PT Re-Evaluation  03/13/19    Authorization Type  medicare    PT Start Time  [id number]    PT Stop Time  1225    PT Time Calculation (min)  40 min    Activity Tolerance  Patient tolerated treatment well;No increased pain    Behavior During Therapy  WFL for tasks assessed/performed       Past Medical History:  Diagnosis Date  . Anxiety disorder   . Fibromyalgia 09/14/2016  . Glaucoma   . IBS (irritable bowel syndrome)   . Low iron   . Migraine headache   . Myalgia and myositis 07/18/2012  . Myalgia and myositis, unspecified     Past Surgical History:  Procedure Laterality Date  . MOHS SURGERY     Squamous cell  . TONSILLECTOMY      There were no vitals filed for this visit.  Subjective Assessment - 02/05/19 1150    Subjective  I felt good after last visit. I had less pain. The pain in my hips are better. I am crossing my legs better.    Patient Stated Goals  cross legs and go up and down stairs    Currently in Pain?  Yes    Pain Score  4     Pain Location  --   hips and pelvis   Pain Orientation  Right;Left;Lower    Pain Descriptors / Indicators  Aching    Pain Type  Chronic pain    Pain Onset  More than a month ago    Pain Frequency  Constant    Aggravating Factors   after exercise    Pain Relieving Factors  yoga, stretches    Multiple Pain Sites  No                       [facility] Adult PT Treatment/Exercise - 02/05/19 0001      Lumbar Exercises: Aerobic   Nustep  just legs, level 1 for 5 min while assessing the patient      Lumbar Exercises: Supine    Ab Set  10 reps;5 seconds    Clam  20 reps;1 second   abdominal bracing   Bridge  10 reps;1 second   VC to not leg hip wobble     Manual Therapy   Manual Therapy  Soft tissue mobilization    Manual therapy comments  used assistive device to pull the fascia up from the muscle    Soft tissue mobilization  elongation of the lumbar paraspinals, gluteals, and piriformis to elongate the tissue  after dry needling       Trigger Point Dry Needling - 02/05/19 0001    Consent Given?  Yes    Education Handout Provided  Previously provided    Muscles Treated Back/Hip  Gluteus medius;Gluteus maximus;Lumbar multifidi    Gluteus Medius Response  Twitch response elicited;Palpable increased muscle length    Gluteus Maximus Response  Palpable increased muscle length;Twitch response elicited    Lumbar multifidi Response  Twitch response elicited;Palpable increased muscle length  PT Education - 02/05/19 1227    Education Details  Access Code: [email or web address]    Person(s) Educated  Patient    Methods  Explanation;Demonstration;Verbal cues;Handout    Comprehension  Verbalized understanding;Returned demonstration       PT Short Term Goals - 02/05/19 1152      PT SHORT TERM GOAL #2   Title  able to go up and down stairs with >/= 50% greater ease due to improved flexibility    Baseline  when went to attic it was better    Time  4    Period  Weeks    Status  On-going      PT SHORT TERM GOAL #3   Title  able to cross her legs with >/= 25% greater ease due to reduction of pain    Time  4    Period  Weeks    Status  On-going    Target Date  02/13/19        PT Long Term Goals - 01/16/19 1431      PT LONG TERM GOAL #1   Title  independent with advanced HEP    Time  8    Period  Weeks    Status  New    Target Date  03/13/19      PT LONG TERM GOAL #2   Title  go [place] and [place] >/= 70%    Time  8    Period  Weeks    Status  New     Target Date  03/13/19      PT LONG TERM GOAL #3   Title  cross her legs with greater ease due to improved flexibility in her leg muscles and with >/= 50% less discomfort    Time  8    Period  Weeks    Status  New    Target Date  03/13/19      PT LONG TERM GOAL #4   Title  lift grocery bags with correct body mechanics to reduce the strain on her back and legs    Time  8    Period  Weeks    Status  New    Target Date  03/13/19      PT LONG TERM GOAL #5   Title  FOTO score </= 41 % limitation    Time  8    Period  Weeks    Status  New    Target Date  03/13/19            Plan - 02/05/19 1212    Clinical Impression Statement  Patient is able to cross her legs and go up stairs with greater ease. Patient responded well to the dry needling and did not have as many as trigger points. Patient was able to start stabilization exercises. Patient only did the legs on the nustep due to her not wanting her shoulders to spasm. Patient will benefit from skilled therapy to reduce pain and improve strength.    Personal Factors and Comorbidities  Age;Comorbidity 1;Comorbidity 2;Comorbidity 3+;Time since onset of injury/illness/exacerbation    Comorbidities  Osteoporosis, IBS, Fibromyalgia    Examination-Activity Limitations  Stand;Stairs;Squat;Sit    Examination-Participation Restrictions  Meal Prep;Cleaning;Community Activity;Laundry    Stability/Clinical Decision Making  Evolving/Moderate complexity    Rehab Potential  Good    PT Frequency  1x / week    PT Duration  8 weeks    PT  Treatment/Interventions  ADLs/Self Care Home Management;Cryotherapy;Electrical Stimulation;Iontophoresis 4mg /ml Dexamethasone;Moist Heat;Ultrasound;Therapeutic exercise;Therapeutic activities;Stair training;Gait training;Neuromuscular re-education;Patient/family education;Dry needling;Manual techniques    PT Next Visit Plan  assess dry needling, work on trunk isometrics, knee flexion and extension with theraband  for strength, hip ROM for crossing her legs    PT Home Exercise Plan  Access Code: [email or web address]    Consulted and Agree with Plan of Care  Patient       Patient will benefit from skilled therapeutic intervention in order to improve the following deficits and impairments:  Increased fascial restricitons, Increased muscle spasms, Pain, Decreased strength, Impaired flexibility  Visit Diagnosis: Chronic bilateral low back pain, unspecified whether sciatica present  Muscle weakness (generalized)  Cramp and spasm     Problem List Patient Active Problem List   Diagnosis Date Noted  . Fibromyalgia 09/14/2016  . 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 Chronic serous otitis media of right ear 12/31/2015  . Myalgia 07/18/2012  . Pain in limb 12/20/2011  . Migraine without aura 12/20/2011    Earlie Counts, PT 02/05/19 12:30 PM   [place] [facility] [address], [place], [place], [address] Phone: [phone]   Fax:  [phone]  Name: Brenda Smith MRN: [id number] Date of Birth: 1950-11-02

## 2019-02-12 ENCOUNTER — Encounter: Payer: Medicare Other | Admitting: Physical Therapy

## 2019-02-19 ENCOUNTER — Encounter: Payer: Medicare Other | Admitting: Physical Therapy

## 2019-03-06 ENCOUNTER — Other Ambulatory Visit: Payer: Self-pay

## 2019-03-06 ENCOUNTER — Ambulatory Visit: Payer: Medicare Other | Admitting: Physical Therapy

## 2019-03-06 ENCOUNTER — Encounter: Payer: Self-pay | Admitting: Physical Therapy

## 2019-03-06 DIAGNOSIS — G8929 Other chronic pain: Secondary | ICD-10-CM

## 2019-03-06 DIAGNOSIS — R252 Cramp and spasm: Secondary | ICD-10-CM

## 2019-03-06 DIAGNOSIS — M545 Low back pain: Secondary | ICD-10-CM | POA: Diagnosis not present

## 2019-03-06 DIAGNOSIS — M6281 Muscle weakness (generalized): Secondary | ICD-10-CM

## 2019-03-06 NOTE — Therapy (Signed)
[facility] [address] [place], [place], [address] Phone: [phone]   Fax:  [phone]  Physical Therapy Treatment  Patient Details  Name: Brenda Smith MRN: [id number] Date of Birth: February 24, 1951 Referring Provider (PT): Dr. Maurice Small   Encounter Date: 03/06/2019  PT End of Session - 03/06/19 1222    Visit Number  5    Date for PT Re-Evaluation  03/13/19    Authorization Type  medicare    PT Start Time  7564    PT Stop Time  3329    PT Time Calculation (min)  50 min    Activity Tolerance  Patient tolerated treatment well;No increased pain    Behavior During Therapy  WFL for tasks assessed/performed       Past Medical History:  Diagnosis Date  . Anxiety disorder   . Fibromyalgia 09/14/2016  . Glaucoma   . IBS (irritable bowel syndrome)   . Low iron   . Migraine headache   . Myalgia and myositis 07/18/2012  . Myalgia and myositis, unspecified     Past Surgical History:  Procedure Laterality Date  . MOHS SURGERY     Squamous cell  . TONSILLECTOMY      There were no vitals filed for this visit.  Subjective Assessment - 03/06/19 1147    Subjective  I was sick after dry needling. I had muscle spasms all over. Maybe it was too much dry needling at one time.    Patient Stated Goals  cross legs and go up and down stairs    Currently in Pain?  Yes    Pain Score  3     Pain Location  --   hips and pelvis   Pain Orientation  Right;Left;Lower    Pain Descriptors / Indicators  Aching    Pain Type  Chronic pain    Pain Onset  More than a month ago    Pain Frequency  Constant    Aggravating Factors   after exercise, crossing the leg over the bod    Pain Relieving Factors  yoga, stretches    Multiple Pain Sites  No                       [facility] Adult PT Treatment/Exercise - 03/06/19 0001      Modalities   Modalities  Electrical Stimulation;Moist Heat      Moist Heat Therapy   Number Minutes Moist Heat  15 Minutes    Moist Heat Location  Lumbar Spine   supine     Electrical Stimulation   Electrical Stimulation Location  Lumbar    Electrical Stimulation Action  IFC    Electrical Stimulation Parameters  to patient tolerance, 15 min.     Electrical Stimulation Goals  Pain      Manual Therapy   Manual Therapy  Soft tissue mobilization    Manual therapy comments  using facial lifting device along the lumbar spine to pull the skno from the bone    Soft tissue mobilization  elongation of the lumbar paraspinals, quadratus lumborum and gluteals after dry needling       Trigger Point Dry Needling - 03/06/19 0001    Consent Given?  Yes    Education Handout Provided  Previously provided    Muscles Treated Back/Hip  Lumbar multifidi    Lumbar multifidi Response  Twitch response elicited;Palpable increased muscle length  PT Short Term Goals - 03/06/19 1227      PT SHORT TERM GOAL #1   Title  independent with initial HEP    Time  4    Period  Weeks    Status  Achieved    Target Date  02/13/19      PT SHORT TERM GOAL #2   Title  able to go up and down stairs with >/= 50% greater ease due to improved flexibility    Time  4    Period  Weeks    Status  On-going    Target Date  02/13/19      PT SHORT TERM GOAL #3   Title  able to cross her legs with >/= 25% greater ease due to reduction of pain    Time  4    Period  Weeks    Status  On-going    Target Date  02/13/19        PT Long Term Goals - 01/16/19 1431      PT LONG TERM GOAL #1   Title  independent with advanced HEP    Time  8    Period  Weeks    Status  New    Target Date  03/13/19      PT LONG TERM GOAL #2   Title  go [place] and [place] >/= 70%    Time  8    Period  Weeks    Status  New    Target Date  03/13/19      PT LONG TERM GOAL #3   Title  cross her legs with greater ease due to improved flexibility in her leg  muscles and with >/= 50% less discomfort    Time  8    Period  Weeks    Status  New    Target Date  03/13/19      PT LONG TERM GOAL #4   Title  lift grocery bags with correct body mechanics to reduce the strain on her back and legs    Time  8    Period  Weeks    Status  New    Target Date  03/13/19      PT LONG TERM GOAL #5   Title  FOTO score </= 41 % limitation    Time  8    Period  Weeks    Status  New    Target Date  03/13/19            Plan - 03/06/19 1223    Clinical Impression Statement  Patient had increased pain and intestinal cramping and muscle cramping after last therapy and took several days to recover. Patient was very nervous but after talking to her and explaining how we will go slowly and assess after each needle. Patient had some increased muscle tension. Patient did have some referral pain into her groin and hips. Patient has not met goals due to set back and needs to be progressed slowly.  Patient will benefit from skilled therapy to reduce pain and improve strength.    Personal Factors and Comorbidities  Age;Comorbidity 1;Comorbidity 2;Comorbidity 3+;Time since onset of injury/illness/exacerbation    Comorbidities  Osteoporosis, IBS, Fibromyalgia    Examination-Activity Limitations  Stand;Stairs;Squat;Sit    Examination-Participation Restrictions  Meal Prep;Cleaning;Community Activity;Laundry    Stability/Clinical Decision Making  Evolving/Moderate complexity    Rehab Potential  Good    PT Frequency  1x / week    PT Duration  8 weeks    PT Treatment/Interventions  ADLs/Self Care Home Management;Cryotherapy;Electrical Stimulation;Iontophoresis '4mg'$ /ml Dexamethasone;Moist Heat;Ultrasound;Therapeutic exercise;Therapeutic activities;Stair training;Gait training;Neuromuscular re-education;Patient/family education;Dry needling;Manual techniques    PT Next Visit Plan  assess dry needling, just do one area of dry needling, write renewal, trunk isometrics, extension  and flexion with theraband for knee, Hip ROM for corssing her legs    PT Home Exercise Plan  Access Code: [email or web address]    Consulted and Agree with Plan of Care  Patient       Patient will benefit from skilled therapeutic intervention in order to improve the following deficits and impairments:  Increased fascial restricitons, Increased muscle spasms, Pain, Decreased strength, Impaired flexibility  Visit Diagnosis: Chronic bilateral low back pain, unspecified whether sciatica present  Muscle weakness (generalized)  Cramp and spasm     Problem List Patient Active Problem List   Diagnosis Date Noted  . Fibromyalgia 09/14/2016  . 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 Chronic serous otitis media of right ear 12/31/2015  . Myalgia 07/18/2012  . Pain in limb 12/20/2011  . Migraine without aura 12/20/2011    Earlie Counts, PT 03/06/19 12:29 PM   [place] [facility] [address], [place], [place], [address] Phone: [phone]   Fax:  [phone]  Name: Abria Vannostrand MRN: [id number] Date of Birth: 1950/07/23

## 2019-03-13 ENCOUNTER — Encounter: Payer: Self-pay | Admitting: Physical Therapy

## 2019-03-13 ENCOUNTER — Other Ambulatory Visit: Payer: Self-pay

## 2019-03-13 ENCOUNTER — Ambulatory Visit: Payer: Medicare Other | Attending: Family Medicine | Admitting: Physical Therapy

## 2019-03-13 DIAGNOSIS — M6281 Muscle weakness (generalized): Secondary | ICD-10-CM | POA: Diagnosis present

## 2019-03-13 DIAGNOSIS — M545 Low back pain: Secondary | ICD-10-CM | POA: Diagnosis present

## 2019-03-13 DIAGNOSIS — R252 Cramp and spasm: Secondary | ICD-10-CM | POA: Diagnosis present

## 2019-03-13 DIAGNOSIS — G8929 Other chronic pain: Secondary | ICD-10-CM

## 2019-03-13 NOTE — Patient Instructions (Signed)
Access Code: [email or web address]  URL: [email or web address]  Date: 03/13/2019  Prepared by: Earlie Counts   Program Notes  place one electrode on right hip and left mid back place one electrode on left hip and right midback   Exercises Supine Transversus Abdominis Bracing - Hands on Stomach - 10 reps - 1 sets - 5 sec hold - 1x daily - 7x weekly Bent Knee Fallouts - 10 reps - 1 sets - 1x daily - 7x weekly Supine Bridge - 10 reps - 1 sets - 5 sec hold - 1x daily - 7x weekly Hooklying Isometric Hip Flexion - 5 reps - 2 sets - 5 sec hold - 1x daily - 7x weekly [facility] [address], [place],  [address] Phone # [phone] Fax [phone]

## 2019-03-13 NOTE — Therapy (Signed)
[facility] [address] [place], [place], [address] Phone: [phone]   Fax:  [phone]  Physical Therapy Treatment  Patient Details  Name: Brenda Smith MRN: [id number] Date of Birth: Sep 24, 1950 Referring Provider (PT): Dr. Maurice Small   Encounter Date: 03/13/2019  PT End of Session - 03/13/19 1148    Visit Number  6    Date for PT Re-Evaluation  05/08/19    Authorization Type  medicare    PT Start Time  [id number]    PT Stop Time  1225    PT Time Calculation (min)  40 min    Activity Tolerance  Patient tolerated treatment well;No increased pain    Behavior During Therapy  WFL for tasks assessed/performed       Past Medical History:  Diagnosis Date  . Anxiety disorder   . Fibromyalgia 09/14/2016  . Glaucoma   . IBS (irritable bowel syndrome)   . Low iron   . Migraine headache   . Myalgia and myositis 07/18/2012  . Myalgia and myositis, unspecified     Past Surgical History:  Procedure Laterality Date  . MOHS SURGERY     Squamous cell  . TONSILLECTOMY      There were no vitals filed for this visit.  Subjective Assessment - 03/13/19 1149    Subjective  Doing the less was better last treatment. I had a spasm in the left hip that went into the tailbone. The electrical stimulation helped. I feel I have to go slower with rehab so the nerve endings do not get over reactive. I was able to go up and down the stairs to the attic easier. I had spasms and did not have to take a muscle relaxer.    Patient Stated Goals  cross legs and go up and down stairs    Currently in Pain?  Yes    Pain Score  5     Pain Location  --   pelvis, hip area   Pain Orientation  Right;Left;Lower    Pain Descriptors / Indicators  Aching    Pain Type  Chronic pain    Pain Onset  More than a month ago    Pain Frequency  Constant    Aggravating Factors   after exercise, crossing the leg over the body    Pain Relieving Factors  yoga,  stretches    Multiple Pain Sites  No         [facility] PT Assessment - 03/13/19 0001      Assessment   Medical Diagnosis  M54.30 Sciatica, unsepecified laterality    Referring Provider (PT)  Dr. Maurice Small    Onset Date/Surgical Date  --   chronic, past 3 years   Prior Therapy  none      Precautions   Precautions  Other (comment)    Precaution Comments  Breast cancer, osteoporosis      Restrictions   Weight Bearing Restrictions  No      [place] residence      Prior Function   Level of Independence  Independent    Leisure  yoga daily, walk every other day      Cognition   Overall Cognitive Status  Within Functional Limits for tasks assessed      Observation/Other Assessments   Focus on Therapeutic Outcomes (FOTO)   48% limitation; goal is 41% limited      Posture/Postural Control   Posture/Postural Control  No significant limitations      AROM   Lumbar Extension  decreased by 25%    Lumbar - Right Rotation  decreased by 25%    Lumbar - Left Rotation  decreased by 25%      Strength   Right Hip Extension  4/5    Right Hip ABduction  3+/5    Left Hip Extension  4/5    Left Hip ABduction  3+/5    Right Knee Flexion  4/5    Right Knee Extension  5/5    Left Knee Flexion  4/5    Left Knee Extension  5/5      Palpation   SI assessment   pelvis in correct alignment                   [facility] Adult PT Treatment/Exercise - 03/13/19 0001      Lumbar Exercises: Supine   Ab Set  10 reps;5 seconds    AB Set Limitations  while pressing the shoulders and arms into the mat    Isometric Hip Flexion  5 seconds;5 reps    Isometric Hip Flexion Limitations  monitoring for pain      Modalities   Modalities  Electrical Stimulation;Moist Heat      Moist Heat Therapy   Number Minutes Moist Heat  15 Minutes    Moist Heat Location  Lumbar Spine   supine     Electrical Stimulation   Electrical Stimulation Location  Lumbar     Electrical Stimulation Action  IFC    Electrical Stimulation Parameters  to patient tolerance, 15 min    Electrical Stimulation Goals  Pain      Manual Therapy   Manual Therapy  Soft tissue mobilization    Soft tissue mobilization  left gluteal and lumbar in right sidely       Trigger Point Dry Needling - 03/13/19 0001    Consent Given?  Yes    Education Handout Provided  Previously provided    Muscles Treated Head and Neck  Suboccipitals    Muscles Treated Back/Hip  Gluteus minimus;Gluteus medius;Gluteus maximus   left   Gluteus Minimus Response  Twitch response elicited;Palpable increased muscle length    Gluteus Medius Response  Twitch response elicited;Palpable increased muscle length    Gluteus Maximus Response  Palpable increased muscle length;Twitch response elicited           PT Education - 03/13/19 1230    Education Details  Access Code: [email or web address]    Person(s) Educated  Patient    Methods  Explanation;Demonstration;Verbal cues;Handout    Comprehension  Verbalized understanding;Returned demonstration       PT Short Term Goals - 03/13/19 1158      PT SHORT TERM GOAL #2   Title  able to go up and down stairs with >/= 50% greater ease due to improved flexibility    Baseline  25% eeasier    Time  4    Period  Weeks    Status  On-going    Target Date  02/13/19      PT SHORT TERM GOAL #3   Title  able to cross her legs with >/= 25% greater ease due to reduction of pain    Time  4    Period  Weeks    Status  Achieved    Target Date  02/13/19        PT Long Term Goals - 01/16/19 1431  PT LONG TERM GOAL #1   Title  independent with advanced HEP    Time  8    Period  Weeks    Status  New    Target Date  03/13/19      PT LONG TERM GOAL #2   Title  go [place] and [place] with >/= 70%    Time  8    Period  Weeks    Status  New    Target Date  03/13/19      PT LONG TERM GOAL #3   Title  cross her legs with  greater ease due to improved flexibility in her leg muscles and with >/= 50% less discomfort    Time  8    Period  Weeks    Status  New    Target Date  03/13/19      PT LONG TERM GOAL #4   Title  lift grocery bags with correct body mechanics to reduce the strain on her back and legs    Time  8    Period  Weeks    Status  New    Target Date  03/13/19      PT LONG TERM GOAL #5   Title  FOTO score </= 41 % limitation    Time  8    Period  Weeks    Status  New    Target Date  03/13/19            Plan - 03/13/19 1221    Clinical Impression Statement  Patient is able to cross her leg over the other and go up the attic stairs with 25% greater ease. Patient does better going slowly in therapy due to her nerves and muscles being hypersensitive. Patient responds better if one area is dry needled compared to several areas and doing 2 exericses. Patient exercises at the gym 3 times per week. Patient is using her home TENS unit for pain management. Patient lumbar is limited by 25% and she has weakness in bilateral hips and trunk. Patient will benefit from skilled therapy to monitor her pain and technique as she progress her core strength and understand ways to manage pain.    Personal Factors and Comorbidities  Age;Comorbidity 1;Comorbidity 2;Comorbidity 3+;Time since onset of injury/illness/exacerbation    Comorbidities  Osteoporosis, IBS, Fibromyalgia    Examination-Activity Limitations  Stand;Stairs;Squat;Sit    Examination-Participation Restrictions  Meal Prep;Cleaning;Community Activity;Laundry    Stability/Clinical Decision Making  Evolving/Moderate complexity    Rehab Potential  Good    PT Frequency  1x / week    PT Duration  8 weeks    PT Treatment/Interventions  ADLs/Self Care Home Management;Cryotherapy;Electrical Stimulation;Iontophoresis 4mg /ml Dexamethasone;Moist Heat;Ultrasound;Therapeutic exercise;Therapeutic activities;Stair training;Gait training;Neuromuscular  re-education;Patient/family education;Dry needling;Manual techniques    PT Next Visit Plan  just do one area of dry needling,  trunk isometrics, extension and abduction isometric for hip; Hip ROM for corssing her legs    PT Home Exercise Plan  Access Code: [email or web address]    Recommended Other Services  sent MD renewal on 1//2021       Patient will benefit from skilled therapeutic intervention in order to improve the following deficits and impairments:  Increased fascial restricitons, Increased muscle spasms, Pain, Decreased strength, Impaired flexibility  Visit Diagnosis: Chronic bilateral low back pain, unspecified whether sciatica present - Plan: PT plan of care cert/re-cert  Muscle weakness (generalized) - Plan: PT plan of care cert/re-cert  Cramp  and spasm - Plan: PT plan of care cert/re-cert     Problem List Patient Active Problem List   Diagnosis Date Noted  . Fibromyalgia 09/14/2016  . 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 Chronic serous otitis media of right ear 12/31/2015  . Myalgia 07/18/2012  . Pain in limb 12/20/2011  . Migraine without aura 12/20/2011    Earlie Counts, PT 03/13/19 12:31 PM   [facility] [address] [place], [place], [address] Phone: [phone]   Fax:  [phone]  Name: Khori Greaney MRN: [id number] Date of Birth: 1950/08/29

## 2019-03-20 ENCOUNTER — Other Ambulatory Visit: Payer: Self-pay

## 2019-03-20 ENCOUNTER — Encounter (INDEPENDENT_AMBULATORY_CARE_PROVIDER_SITE_OTHER): Payer: Self-pay

## 2019-03-20 ENCOUNTER — Encounter: Payer: Self-pay | Admitting: Physical Therapy

## 2019-03-20 ENCOUNTER — Ambulatory Visit: Payer: Medicare Other | Admitting: Physical Therapy

## 2019-03-20 DIAGNOSIS — G8929 Other chronic pain: Secondary | ICD-10-CM

## 2019-03-20 DIAGNOSIS — R252 Cramp and spasm: Secondary | ICD-10-CM

## 2019-03-20 DIAGNOSIS — M6281 Muscle weakness (generalized): Secondary | ICD-10-CM

## 2019-03-20 DIAGNOSIS — M545 Low back pain: Secondary | ICD-10-CM | POA: Diagnosis not present

## 2019-03-20 NOTE — Patient Instructions (Signed)
Access Code: [email or web address]  URL: [email or web address]  Date: 03/20/2019  Prepared by: Earlie Counts   Program Notes  place one electrode on right hip and left mid back place one electrode on left hip and right midback   Exercises Supine Transversus Abdominis Bracing - Hands on Stomach - 10 reps - 1 sets - 5 sec hold - 1x daily - 7x weekly Bent Knee Fallouts - 10 reps - 1 sets - 1x daily - 7x weekly Supine Bridge - 10 reps - 1 sets - 5 sec hold - 1x daily - 7x weekly Hooklying Isometric Hip Flexion - 5 reps - 2 sets - 5 sec hold - 1x daily - 7x weekly Supine Transversus Abdominis Bracing with Heel Slide - 10 reps - 1 sets - 1x daily - 7x weekly Supine Leg Press - 5 reps - 1 sets - 5 sec hold - 1x daily - 7x weekly [facility] [address], [place], [place] [address] Phone # [phone] Fax [phone]

## 2019-03-20 NOTE — Therapy (Signed)
[facility] [address], [place], [place], [address] Phone: [phone]   Fax:  [phone]  Physical Therapy Treatment  Patient Details  Name: Brenda Smith MRN: [id number] Date of Birth: 10-Feb-1951 Referring Provider (PT): Dr. Maurice Small   Encounter Date: 03/20/2019  PT End of Session - 03/20/19 1651    Visit Number  7    Date for PT Re-Evaluation  05/08/19    Authorization Type  medicare    PT Start Time  [id number]    PT Stop Time  [id number]    PT Time Calculation (min)  50 min    Activity Tolerance  Patient tolerated treatment well;No increased pain    Behavior During Therapy  WFL for tasks assessed/performed       Past Medical History:  Diagnosis Date  . Anxiety disorder   . Fibromyalgia 09/14/2016  . Glaucoma   . IBS (irritable bowel syndrome)   . Low iron   . Migraine headache   . Myalgia and myositis 07/18/2012  . Myalgia and myositis, unspecified     Past Surgical History:  Procedure Laterality Date  . MOHS SURGERY     Squamous cell  . TONSILLECTOMY      There were no vitals filed for this visit.  Subjective Assessment - 03/20/19 1609    Subjective  The first few days I did not have any different pain. I went to exercise and did my yoga and I had pain in the left groin and pelvis. I felt better on Monday. I feel the dry needling is helping me. I can bring my leg across the midline better.    Patient Stated Goals  cross legs and go up and down stairs    Currently in Pain?  Yes    Pain Score  2     Pain Location  --   pelvis and hip area   Pain Orientation  Left;Right;Lower    Pain Descriptors / Indicators  Aching    Pain Type  Chronic pain    Pain Onset  More than a month ago    Pain Frequency  Constant    Aggravating Factors   after exercise, crossing the leg over the body    Pain Relieving Factors  yoga stretches    Multiple Pain Sites  No                       [facility] Adult  PT Treatment/Exercise - 03/20/19 0001      Lumbar Exercises: Supine   Heel Slides  20 reps    Heel Slides Limitations  10x each side with abdominal bracing    Other Supine Lumbar Exercises  supine leg press 5 sec and push heel away 5x each leg      Modalities   Modalities  Electrical Stimulation;Moist Heat      Moist Heat Therapy   Moist Heat Location  Lumbar Spine   supine, and left lower abdominal     Electrical Stimulation   Electrical Stimulation Location  Lumbar    Electrical Stimulation Action  IFC    Electrical Stimulation Parameters  to patient tolerance, 15 min    Electrical Stimulation Goals  Pain      Manual Therapy   Manual Therapy  Soft tissue mobilization    Soft tissue mobilization  left gluteal and lumbar. left iliopsoas, left abdominal  in right sidely  PT Education - 03/20/19 1651    Education Details  Access Code: [email or web address]    Person(s) Educated  Patient    Methods  Explanation;Demonstration;Verbal cues;Handout    Comprehension  Returned demonstration;Verbalized understanding       PT Short Term Goals - 03/13/19 1158      PT SHORT TERM GOAL #2   Title  able to go up and down stairs with >/= 50% greater ease due to improved flexibility    Baseline  25% eeasier    Time  4    Period  Weeks    Status  On-going    Target Date  02/13/19      PT SHORT TERM GOAL #3   Title  able to cross her legs with >/= 25% greater ease due to reduction of pain    Time  4    Period  Weeks    Status  Achieved    Target Date  02/13/19        PT Long Term Goals - 01/16/19 1431      PT LONG TERM GOAL #1   Title  independent with advanced HEP    Time  8    Period  Weeks    Status  New    Target Date  03/13/19      PT LONG TERM GOAL #2   Title  go [place] and [place] >/= 70%    Time  8    Period  Weeks    Status  New    Target Date  03/13/19      PT LONG TERM GOAL #3   Title  cross her legs  with greater ease due to improved flexibility in her leg muscles and with >/= 50% less discomfort    Time  8    Period  Weeks    Status  New    Target Date  03/13/19      PT LONG TERM GOAL #4   Title  lift grocery bags with correct body mechanics to reduce the strain on her back and legs    Time  8    Period  Weeks    Status  New    Target Date  03/13/19      PT LONG TERM GOAL #5   Title  FOTO score </= 41 % limitation    Time  8    Period  Weeks    Status  New    Target Date  03/13/19            Plan - 03/20/19 1638    Clinical Impression Statement  Patient did not have the full body pain she had the other time with dry needling. Patient had some trigger points in the left gluteus medius and maximus. Patient does best with dry needling in one area at a time then 2 exercises. Her body responds slowly. Patient will benefit from skilled therapy to monitor her pain and technique as she progress her core strength and understand ways to manage pain.    Personal Factors and Comorbidities  Age;Comorbidity 1;Comorbidity 2;Comorbidity 3+;Time since onset of injury/illness/exacerbation    Comorbidities  Osteoporosis, IBS, Fibromyalgia    Examination-Activity Limitations  Stand;Stairs;Squat;Sit    Examination-Participation Restrictions  Meal Prep;Cleaning;Community Activity;Laundry    Rehab Potential  Good    PT Frequency  1x / week    PT Duration  8 weeks    PT Treatment/Interventions  ADLs/Self Care Home Management;Cryotherapy;Electrical Stimulation;Iontophoresis 4mg /ml Dexamethasone;Moist Heat;Ultrasound;Therapeutic exercise;Therapeutic activities;Stair training;Gait training;Neuromuscular re-education;Patient/family education;Dry needling;Manual techniques    PT Next Visit Plan  just do one area of dry needling,  trunk isometrics, extension and abduction isometric for hip; Hip ROM for corssing her legs    PT Home Exercise Plan  Access Code: [email or web address]    Recommended Other Services  MD  signed renewal    Consulted and Agree with Plan of Care  Patient       Patient will benefit from skilled therapeutic intervention in order to improve the following deficits and impairments:  Increased fascial restricitons, Increased muscle spasms, Pain, Decreased strength, Impaired flexibility  Visit Diagnosis: Chronic bilateral low back pain, unspecified whether sciatica present  Muscle weakness (generalized)  Cramp and spasm     Problem List Patient Active Problem List   Diagnosis Date Noted  . Fibromyalgia 09/14/2016  . 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 Chronic serous otitis media of right ear 12/31/2015  . Myalgia 07/18/2012  . Pain in limb 12/20/2011  . Migraine without aura 12/20/2011    Earlie Counts, PT 03/20/19 5:01 PM   [facility] [address], [place], [place], [address] Phone: [phone]   Fax:  [phone]  Name: Brenda Smith MRN: [id number] Date of Birth: 13-Oct-1950

## 2019-03-26 ENCOUNTER — Ambulatory Visit: Payer: Medicare Other | Admitting: Physical Therapy

## 2019-03-26 ENCOUNTER — Other Ambulatory Visit: Payer: Self-pay

## 2019-03-26 ENCOUNTER — Encounter: Payer: Self-pay | Admitting: Physical Therapy

## 2019-03-26 DIAGNOSIS — G8929 Other chronic pain: Secondary | ICD-10-CM

## 2019-03-26 DIAGNOSIS — M545 Low back pain: Secondary | ICD-10-CM | POA: Diagnosis not present

## 2019-03-26 DIAGNOSIS — M6281 Muscle weakness (generalized): Secondary | ICD-10-CM

## 2019-03-26 DIAGNOSIS — R252 Cramp and spasm: Secondary | ICD-10-CM

## 2019-03-26 NOTE — Therapy (Signed)
[facility] [address], [place], [place], [address] Phone: [phone]   Fax:  [phone]  Physical Therapy Treatment  Patient Details  Name: Brenda Smith MRN: [id number] Date of Birth: Nov 05, 1950 Referring Provider (PT): Dr. Maurice Small   Encounter Date: 03/26/2019  PT End of Session - 03/26/19 1606    Visit Number  8    Date for PT Re-Evaluation  05/08/19    Authorization Type  medicare    PT Start Time  [id number]    PT Stop Time  1620    PT Time Calculation (min)  50 min    Activity Tolerance  Patient tolerated treatment well;No increased pain    Behavior During Therapy  WFL for tasks assessed/performed       Past Medical History:  Diagnosis Date  . Anxiety disorder   . Fibromyalgia 09/14/2016  . Glaucoma   . IBS (irritable bowel syndrome)   . Low iron   . Migraine headache   . Myalgia and myositis 07/18/2012  . Myalgia and myositis, unspecified     Past Surgical History:  Procedure Laterality Date  . MOHS SURGERY     Squamous cell  . TONSILLECTOMY      There were no vitals filed for this visit.  Subjective Assessment - 03/26/19 1532    Subjective  I only had one spasm issue in the left lower quadrant. The intensity and duration of the spasms have lessen. The left inner thigh is very sensitive to the exercise. The right side is stiff. [place] I will have to bring the my luggage and groceries.    Patient Stated Goals  cross legs and go up and down stairs    Currently in Pain?  Yes    Pain Score  3     Pain Location  --   pelvis and hip area   Pain Orientation  Right;Left;Lower    Pain Descriptors / Indicators  Aching    Pain Type  Chronic pain    Pain Onset  More than a month ago    Pain Frequency  Constant    Aggravating Factors   after exercise, crossing the leg over the body    Pain Relieving Factors  yoga stretches                        [facility] Adult PT Treatment/Exercise - 03/26/19 0001      Lumbar Exercises: Supine   Clam  10 reps;1 second    Heel Slides  20 reps    Heel Slides Limitations  10x each side with abdominal bracing    Bridge  10 reps    Isometric Hip Flexion  5 seconds;5 reps    Isometric Hip Flexion Limitations  monitoring for pain    Other Supine Lumbar Exercises  body press into the mat       Modalities   Modalities  Electrical Stimulation;Moist Heat      Moist Heat Therapy   Number Minutes Moist Heat  15 Minutes    Moist Heat Location  Lumbar Spine   supine, and left lower abdominal     Electrical Stimulation   Electrical Stimulation Location  Lumbar    Electrical Stimulation Action  IFC    Electrical Stimulation Parameters  to patient tolerance, 15 min. in supine    Electrical Stimulation Goals  Pain      Manual Therapy  Manual Therapy  Soft tissue mobilization    Soft tissue mobilization  right gluteal and lumbar paraspinals soft tissue work to elongate after dry needling       Trigger Point Dry Needling - 03/26/19 0001    Consent Given?  Yes    Education Handout Provided  Previously provided    Muscles Treated Back/Hip  Gluteus minimus;Gluteus medius;Gluteus maximus   right   Gluteus Minimus Response  Twitch response elicited;Palpable increased muscle length    Gluteus Medius Response  Twitch response elicited;Palpable increased muscle length    Gluteus Maximus Response  Palpable increased muscle length;Twitch response elicited             PT Short Term Goals - 03/13/19 1158      PT SHORT TERM GOAL #2   Title  able to go up and down stairs with >/= 50% greater ease due to improved flexibility    Baseline  25% eeasier    Time  4    Period  Weeks    Status  On-going    Target Date  02/13/19      PT SHORT TERM GOAL #3   Title  able to cross her legs with >/= 25% greater ease due to reduction of pain    Time  4    Period  Weeks     Status  Achieved    Target Date  02/13/19        PT Long Term Goals - 01/16/19 1431      PT LONG TERM GOAL #1   Title  independent with advanced HEP    Time  8    Period  Weeks    Status  New    Target Date  03/13/19      PT LONG TERM GOAL #2   Title  go up and [place] with >/= 70%    Time  8    Period  Weeks    Status  New    Target Date  03/13/19      PT LONG TERM GOAL #3   Title  cross her legs with greater ease due to improved flexibility in her leg muscles and with >/= 50% less discomfort    Time  8    Period  Weeks    Status  New    Target Date  03/13/19      PT LONG TERM GOAL #4   Title  lift grocery bags with correct body mechanics to reduce the strain on her back and legs    Time  8    Period  Weeks    Status  New    Target Date  03/13/19      PT LONG TERM GOAL #5   Title  FOTO score </= 41 % limitation    Time  8    Period  Weeks    Status  New    Target Date  03/13/19            Plan - 03/26/19 1606    Clinical Impression Statement  Patient reports her muscle spasms are less intense and last for a shorter time. Patient is able to demonstrate her HEP correctly. Patient is having some increased tightness in the right gluteal and hip. Patient knows to go slowly with her HEP. Patient continues to have some difficulty with crossing her legs. Patient still goes to the gym. Patient will benefit from skilled therapy  to monitor her pain and technique as she progresses her core strength and understand ways to manage her pain.    Personal Factors and Comorbidities  Age;Comorbidity 1;Comorbidity 2;Comorbidity 3+;Time since onset of injury/illness/exacerbation    Comorbidities  Osteoporosis, IBS, Fibromyalgia    Examination-Activity Limitations  Stand;Stairs;Squat;Sit    Examination-Participation Restrictions  Meal Prep;Cleaning;Community Activity;Laundry    Stability/Clinical Decision Making  Evolving/Moderate  complexity    Rehab Potential  Good    PT Frequency  1x / week    PT Duration  8 weeks    PT Treatment/Interventions  ADLs/Self Care Home Management;Cryotherapy;Electrical Stimulation;Iontophoresis 4mg /ml Dexamethasone;Moist Heat;Ultrasound;Therapeutic exercise;Therapeutic activities;Stair training;Gait training;Neuromuscular re-education;Patient/family education;Dry needling;Manual techniques    PT Next Visit Plan  just do one area of dry needling,  soft tissue work, work on crossing legs, electrical stimulation at the end, work on stairs    PT Home Exercise Plan  Access Code: [email or web address]    Consulted and Agree with Plan of Care  Patient       Patient will benefit from skilled therapeutic intervention in order to improve the following deficits and impairments:  Increased fascial restricitons, Increased muscle spasms, Pain, Decreased strength, Impaired flexibility  Visit Diagnosis: Chronic bilateral low back pain, unspecified whether sciatica present  Muscle weakness (generalized)  Cramp and spasm     Problem List Patient Active Problem List   Diagnosis Date Noted  . Fibromyalgia 09/14/2016  . 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 Chronic serous otitis media of right ear 12/31/2015  . Myalgia 07/18/2012  . Pain in limb 12/20/2011  . Migraine without aura 12/20/2011    Earlie Counts, PT 03/26/19 4:12 PM   [facility] [address], [place], [place], [address] Phone: [phone]   Fax:  [phone]  Name: Savon Giuffrida MRN: [id number] Date of Birth: June 30, 1950

## 2019-04-02 ENCOUNTER — Ambulatory Visit: Payer: Self-pay

## 2019-04-03 ENCOUNTER — Other Ambulatory Visit: Payer: Self-pay

## 2019-04-03 ENCOUNTER — Encounter: Payer: Self-pay | Admitting: Physical Therapy

## 2019-04-03 ENCOUNTER — Ambulatory Visit: Payer: Medicare Other | Admitting: Physical Therapy

## 2019-04-03 DIAGNOSIS — M545 Low back pain, unspecified: Secondary | ICD-10-CM

## 2019-04-03 DIAGNOSIS — M6281 Muscle weakness (generalized): Secondary | ICD-10-CM

## 2019-04-03 DIAGNOSIS — G8929 Other chronic pain: Secondary | ICD-10-CM

## 2019-04-03 DIAGNOSIS — R252 Cramp and spasm: Secondary | ICD-10-CM

## 2019-04-03 NOTE — Therapy (Signed)
[facility] [address] [place], [place], [address] Phone: [phone]   Fax:  [phone]  Physical Therapy Treatment  Patient Details  Name: Brenda Smith MRN: [id number] Date of Birth: 1950-05-08 Referring Provider (PT): Dr. Maurice Small   Encounter Date: 04/03/2019  PT End of Session - 04/03/19 1305    Visit Number  9    Date for PT Re-Evaluation  05/08/19    Authorization Type  medicare    PT Start Time  1230    PT Stop Time  1315    PT Time Calculation (min)  45 min    Activity Tolerance  Patient tolerated treatment well;No increased pain    Behavior During Therapy  WFL for tasks assessed/performed       Past Medical History:  Diagnosis Date  . Anxiety disorder   . Fibromyalgia 09/14/2016  . Glaucoma   . IBS (irritable bowel syndrome)   . Low iron   . Migraine headache   . Myalgia and myositis 07/18/2012  . Myalgia and myositis, unspecified     Past Surgical History:  Procedure Laterality Date  . MOHS SURGERY     Squamous cell  . TONSILLECTOMY      There were no vitals filed for this visit.  Subjective Assessment - 04/03/19 1233    Subjective  The work on the right side is better. I do not get as tight on the right side during driving. Last few days have increased my nerves.    Patient Stated Goals  cross legs and go up and down stairs    Currently in Pain?  Yes    Pain Score  5     Pain Location  --   pelvis and hip area   Pain Orientation  Right;Left;Lower    Pain Descriptors / Indicators  Aching    Pain Type  Chronic pain    Pain Onset  More than a month ago    Pain Frequency  Constant    Aggravating Factors   after exercise, crossing the leg over the body    Pain Relieving Factors  yoga stretches                       [facility] Adult PT Treatment/Exercise - 04/03/19 0001      Lumbar Exercises: Standing   Other Standing Lumbar Exercises  practice going up and down  steps without hands 4 times and  advising  patient to stop on the way down stairs to give her a rest and catch her breath      Modalities   Modalities  Electrical Stimulation;Moist Heat      Moist Heat Therapy   Moist Heat Location  Lumbar Spine   supine, and left lower abdominal     Electrical Stimulation   Electrical Stimulation Location  Lumbar    Electrical Stimulation Action  IFC    Electrical Stimulation Parameters  To pateint tolerance, 15 minutes    Electrical Stimulation Goals  Pain      Manual Therapy   Manual Therapy  Soft tissue mobilization    Soft tissue mobilization  right gluteal and lumbar paraspinals soft tissue work to elongate after dry needling       Trigger Point Dry Needling - 04/03/19 0001    Consent Given?  Yes    Education Handout Provided  Previously provided    Muscles Treated Back/Hip  Gluteus minimus;Gluteus medius;Gluteus maximus   right  Gluteus Minimus Response  Twitch response elicited;Palpable increased muscle length    Gluteus Medius Response  Twitch response elicited;Palpable increased muscle length    Gluteus Maximus Response  Palpable increased muscle length;Twitch response elicited           PT Education - 04/03/19 1305    Education Details  As she goes down steps to rest in the middle and catch her breath    Person(s) Educated  Patient    Methods  Explanation;Demonstration;Verbal cues;Handout    Comprehension  Verbalized understanding;Returned demonstration       PT Short Term Goals - 04/03/19 1310      PT SHORT TERM GOAL #1   Title  independent with initial HEP    Time  4    Period  Weeks    Status  Achieved      PT SHORT TERM GOAL #2   Title  able to go up and down stairs with >/= 50% greater ease due to improved flexibility    Time  4    Period  Weeks    Status  On-going    Target Date  02/13/19      PT SHORT TERM GOAL #3   Title  able to cross her legs with >/= 25% greater ease due to reduction of pain    Time   4    Period  Weeks    Status  Achieved    Target Date  02/13/19        PT Long Term Goals - 01/16/19 1431      PT LONG TERM GOAL #1   Title  independent with advanced HEP    Time  8    Period  Weeks    Status  New    Target Date  03/13/19      PT LONG TERM GOAL #2   Title  [place] and [place] >[place]= 70[place]    Time  8    Period  Weeks    Status  New    Target Date  03/13/19      PT LONG TERM GOAL #3   Title  cross her legs with greater ease due to improved flexibility in her leg muscles and with >/= 50% less discomfort    Time  8    Period  Weeks    Status  New    Target Date  03/13/19      PT LONG TERM GOAL #4   Title  lift grocery bags with correct body mechanics to reduce the strain on her back and legs    Time  8    Period  Weeks    Status  New    Target Date  03/13/19      PT LONG TERM GOAL #5   Title  FOTO score </= 41 % limitation    Time  8    Period  Weeks    Status  New    Target Date  03/13/19            Plan - 04/03/19 1306    Clinical Impression Statement  Patient was able to go up and down stairs without holding onto railing. Patient does go down fast. Patient was directed to stop in the middle of the stairs while going down to reduce chance of her legs cramping and to catch her breath. Patient reports if she has increased pain she is able to  lay down to reduce the muscle spasms now. Patient had fewer trigger points in the right gluteals. Patient will benefit from skilled therapy to monitor her pain and technique as she progresses her core strength and understand ways to manage pain.    Personal Factors and Comorbidities  Age;Comorbidity 1;Comorbidity 2;Comorbidity 3+;Time since onset of injury/illness/exacerbation    Comorbidities  Osteoporosis, IBS, Fibromyalgia    Examination-Activity Limitations  Stand;Stairs;Squat;Sit    Examination-Participation Restrictions  Meal Prep;Cleaning;Community  Activity;Laundry    Stability/Clinical Decision Making  Evolving/Moderate complexity    Rehab Potential  Good    PT Frequency  1x / week    PT Duration  8 weeks    PT Treatment/Interventions  ADLs/Self Care Home Management;Cryotherapy;Electrical Stimulation;Iontophoresis 4mg /ml Dexamethasone;Moist Heat;Ultrasound;Therapeutic exercise;Therapeutic activities;Stair training;Gait training;Neuromuscular re-education;Patient/family education;Dry needling;Manual techniques    PT Next Visit Plan  just do one area of dry needling,  soft tissue work, work on crossing legs, electrical stimulation at the end, work on stairs again, write 10th visit note    PT Home Exercise Plan  Access Code: [email or web address]    Consulted and Agree with Plan of Care  Patient       Patient will benefit from skilled therapeutic intervention in order to improve the following deficits and impairments:  Increased fascial restricitons, Increased muscle spasms, Pain, Decreased strength, Impaired flexibility  Visit Diagnosis: Chronic bilateral low back pain, unspecified whether sciatica present  Muscle weakness (generalized)  Cramp and spasm     Problem List Patient Active Problem List   Diagnosis Date Noted  . Fibromyalgia 09/14/2016  . 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 Chronic serous otitis media of right ear 12/31/2015  . Myalgia 07/18/2012  . Pain in limb 12/20/2011  . Migraine without aura 12/20/2011    Earlie Counts, PT 04/03/19 1:12 PM   [facility] [address] [place], [place], [address] Phone: [phone]   Fax:  [phone]  Name: Brenda Smith MRN: [id number] Date of Birth: 1950/06/04

## 2019-04-11 ENCOUNTER — Ambulatory Visit: Payer: Medicare Other | Attending: Internal Medicine

## 2019-04-11 DIAGNOSIS — Z23 Encounter for immunization: Secondary | ICD-10-CM

## 2019-04-11 NOTE — Progress Notes (Signed)
   Covid-19 Vaccination Clinic  Name:  Terrell Wobig    MRN: [id number] DOB: Jan 20, 1951  04/11/2019  Ms. Garritano was observed post Covid-19 immunization for 15 minutes without incidence. She was provided with Vaccine Information Sheet and instruction to access the V-Safe system.   Ms. Franklyn was instructed to call 911 with any severe reactions post vaccine: Marland Kitchen Difficulty breathing  . Swelling of your face and throat  . A fast heartbeat  . A bad rash all over your body  . Dizziness and weakness    Immunizations Administered    Name Date Dose VIS Date Route   Pfizer COVID-19 Vaccine 04/11/2019  2:47 PM 0.3 mL 02/15/2019 Intramuscular   Manufacturer: [place]   Lot: [id number]   [place]: [id number]

## 2019-04-17 ENCOUNTER — Encounter: Payer: Self-pay | Admitting: Physical Therapy

## 2019-04-17 ENCOUNTER — Ambulatory Visit: Payer: Medicare Other | Attending: Family Medicine | Admitting: Physical Therapy

## 2019-04-17 ENCOUNTER — Other Ambulatory Visit: Payer: Self-pay

## 2019-04-17 DIAGNOSIS — G8929 Other chronic pain: Secondary | ICD-10-CM | POA: Diagnosis present

## 2019-04-17 DIAGNOSIS — R252 Cramp and spasm: Secondary | ICD-10-CM | POA: Insufficient documentation

## 2019-04-17 DIAGNOSIS — M545 Low back pain, unspecified: Secondary | ICD-10-CM

## 2019-04-17 DIAGNOSIS — M6281 Muscle weakness (generalized): Secondary | ICD-10-CM | POA: Diagnosis present

## 2019-04-17 NOTE — Therapy (Signed)
[facility] [address] [place], [place], [address] Phone: [phone]   Fax:  [phone]  Physical Therapy Treatment  Patient Details  Name: Brenda Smith MRN: [id number] Date of Birth: 08-01-1950 Referring Provider (PT): Dr. Maurice Small   Encounter Date: 04/17/2019  PT End of Session - 04/17/19 1406    Visit Number  10    Date for PT Re-Evaluation  05/08/19    Authorization Type  medicare    PT Start Time  1400    PT Stop Time  1445    PT Time Calculation (min)  45 min    Activity Tolerance  Patient tolerated treatment well;No increased pain    Behavior During Therapy  WFL for tasks assessed/performed       Past Medical History:  Diagnosis Date  . Anxiety disorder   . Fibromyalgia 09/14/2016  . Glaucoma   . IBS (irritable bowel syndrome)   . Low iron   . Migraine headache   . Myalgia and myositis 07/18/2012  . Myalgia and myositis, unspecified     Past Surgical History:  Procedure Laterality Date  . MOHS SURGERY     Squamous cell  . TONSILLECTOMY      There were no vitals filed for this visit.  Subjective Assessment - 04/17/19 1402    Subjective  I had my first COVID shot and still having some symptoms. I had deep muscle massage yesterday. I am sore from it.The abdominal muscles are difficult to contract.    Patient Stated Goals  cross legs and go up and down stairs    Currently in Pain?  Yes    Pain Score  2     Pain Location  --   hip and pelvis area   Pain Orientation  Right;Left;Lower    Pain Descriptors / Indicators  Aching    Pain Type  Chronic pain    Pain Onset  More than a month ago    Pain Frequency  Constant    Aggravating Factors   after exercise, crossing the leg over the body    Pain Relieving Factors  yoga stretches    Multiple Pain Sites  No                       [facility] Adult PT Treatment/Exercise - 04/17/19 0001      Lumbar Exercises: Supine   Clam  10  reps;1 second    Heel Slides  20 reps    Heel Slides Limitations  10x each side with abdominal bracing    Isometric Hip Flexion  5 seconds;5 reps    Other Supine Lumbar Exercises  body press into the mat       Modalities   Modalities  Electrical Stimulation;Moist Heat      Moist Heat Therapy   Number Minutes Moist Heat  15 Minutes    Moist Heat Location  Lumbar Spine   supine, and left lower abdominal     Electrical Stimulation   Electrical Stimulation Location  Lumbar    Electrical Stimulation Action  IFC    Electrical Stimulation Parameters  to patient tolerance, 15 min    Electrical Stimulation Goals  Pain      Manual Therapy   Manual Therapy  Soft tissue mobilization    Soft tissue mobilization  left gluteal and lumbar paraspinals soft tissue work to elongate after dry needling       Trigger Point Dry Needling -  04/17/19 0001    Consent Given?  Yes    Education Handout Provided  Previously provided    Muscles Treated Back/Hip  Gluteus minimus;Gluteus medius;Gluteus maximus   left   Gluteus Minimus Response  Twitch response elicited;Palpable increased muscle length    Gluteus Medius Response  Twitch response elicited;Palpable increased muscle length    Gluteus Maximus Response  Palpable increased muscle length;Twitch response elicited             PT Short Term Goals - 04/17/19 1406      PT SHORT TERM GOAL #2   Title  able to go up and down stairs with >/= 50% greater ease due to improved flexibility    Baseline  40% easier    Time  4    Period  Weeks    Status  On-going    Target Date  02/13/19      PT SHORT TERM GOAL #3   Title  able to cross her legs with >/= 25% greater ease due to reduction of pain    Time  4    Period  Weeks    Status  Achieved        PT Long Term Goals - 04/17/19 1408      PT LONG TERM GOAL #1   Title  independent with advanced HEP    Time  8    Period  Weeks    Status  On-going      PT LONG TERM GOAL #2   Title  go up  and [place] >/= 70%    Baseline  40% easier    Period  Weeks    Status  On-going      PT LONG TERM GOAL #3   Title  cross her legs with greater ease due to improved flexibility in her leg muscles and with >/= 50% less discomfort    Baseline  40% easier    Time  8    Period  Weeks    Status  On-going      PT LONG TERM GOAL #4   Title  lift grocery bags with correct body mechanics to reduce the strain on her back and legs    Baseline  lift without difficulty withour stairs    Time  8    Period  Weeks    Status  On-going      PT LONG TERM GOAL #5   Title  FOTO score </= 41 % limitation    Time  8    Period  Weeks    Status  New            Plan - 04/17/19 1434    Clinical Impression Statement  Patient is now able to carry groceries on level ground. Patient reports stairs are 40% easier. Patient is able to cross her legs with 40% less discomfort. Patient is able to exercise with less pain. Patient is able to perform her HEP correctly. Patient will benefit from skilled therapy to monitor her pain and technique as she progresses her core strength and understand ways to manage pain.    Personal Factors and Comorbidities  Age;Comorbidity 1;Comorbidity 2;Comorbidity 3+;Time since onset of injury/illness/exacerbation    Comorbidities  Osteoporosis, IBS, Fibromyalgia    Examination-Activity Limitations  Stand;Stairs;Squat;Sit    Examination-Participation Restrictions  Meal Prep;Cleaning;Community Activity;Laundry    Stability/Clinical Decision Making  Evolving/Moderate complexity    Rehab Potential  Good  PT Frequency  1x / week    PT Duration  8 weeks    PT Treatment/Interventions  ADLs/Self Care Home Management;Cryotherapy;Electrical Stimulation;Iontophoresis 4mg /ml Dexamethasone;Moist Heat;Ultrasound;Therapeutic exercise;Therapeutic activities;Stair training;Gait training;Neuromuscular re-education;Patient/family education;Dry  needling;Manual techniques    PT Next Visit Plan  just do one area of dry needling,  soft tissue work, work on crossing legs, electrical stimulation at the end    PT Home Exercise Plan  Access Code: [email or web address]    Consulted and Agree with Plan of Care  Patient       Patient will benefit from skilled therapeutic intervention in order to improve the following deficits and impairments:  Increased fascial restricitons, Increased muscle spasms, Pain, Decreased strength, Impaired flexibility  Visit Diagnosis: Chronic bilateral low back pain, unspecified whether sciatica present  Muscle weakness (generalized)  Cramp and spasm     Problem List Patient Active Problem List   Diagnosis Date Noted  . Fibromyalgia 09/14/2016  . 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 Chronic serous otitis media of right ear 12/31/2015  . Myalgia 07/18/2012  . Pain in limb 12/20/2011  . Migraine without aura 12/20/2011    Earlie Counts, PT 04/17/19 2:38 PM   [facility] [address] [place], [place], [address] Phone: [phone]   Fax:  [phone]  Name: Brenda Smith MRN: [id number] Date of Birth: 1950/12/04

## 2019-04-23 ENCOUNTER — Encounter: Payer: Self-pay | Admitting: Physical Therapy

## 2019-04-23 ENCOUNTER — Ambulatory Visit: Payer: Medicare Other | Admitting: Physical Therapy

## 2019-04-23 ENCOUNTER — Other Ambulatory Visit: Payer: Self-pay

## 2019-04-23 DIAGNOSIS — M545 Low back pain: Secondary | ICD-10-CM | POA: Diagnosis not present

## 2019-04-23 DIAGNOSIS — R252 Cramp and spasm: Secondary | ICD-10-CM

## 2019-04-23 DIAGNOSIS — M6281 Muscle weakness (generalized): Secondary | ICD-10-CM

## 2019-04-23 DIAGNOSIS — G8929 Other chronic pain: Secondary | ICD-10-CM

## 2019-04-23 NOTE — Therapy (Signed)
[facility] [address], [place], [place], [address] Phone: [phone]   Fax:  [phone]  Physical Therapy Treatment  Patient Details  Name: Brenda Smith MRN: [id number] Date of Birth: 18-Jul-1950 Referring Provider (PT): Dr. Maurice Small   Encounter Date: 04/23/2019  PT End of Session - 04/23/19 1103    Visit Number  11    Date for PT Re-Evaluation  05/08/19    Authorization Type  medicare    PT Start Time  1100    PT Stop Time  1145    PT Time Calculation (min)  45 min    Activity Tolerance  Patient tolerated treatment well;No increased pain    Behavior During Therapy  WFL for tasks assessed/performed       Past Medical History:  Diagnosis Date  . Anxiety disorder   . Fibromyalgia 09/14/2016  . Glaucoma   . IBS (irritable bowel syndrome)   . Low iron   . Migraine headache   . Myalgia and myositis 07/18/2012  . Myalgia and myositis, unspecified     Past Surgical History:  Procedure Laterality Date  . MOHS SURGERY     Squamous cell  . TONSILLECTOMY      There were no vitals filed for this visit.  Subjective Assessment - 04/23/19 1102    Subjective  I had alot of pain in my right side that went across my back and into the diaphragm. I can stand up. The pain has fanned out.    Patient Stated Goals  cross legs and go up and down stairs    Currently in Pain?  Yes    Pain Score  7     Pain Location  --   right hip and pelvis   Pain Orientation  Right    Pain Descriptors / Indicators  Stabbing    Pain Type  Chronic pain    Pain Onset  More than a month ago    Pain Frequency  Constant    Aggravating Factors   sitting    Pain Relieving Factors  yoga stretches    Multiple Pain Sites  No                       [facility] Adult PT Treatment/Exercise - 04/23/19 0001      Lumbar Exercises: Supine   Dead Bug  10 reps;1 second    Dead Bug Limitations  each opposite arm and leg with  abdominal bracing      Lumbar Exercises: Prone   Other Prone Lumbar Exercises  prone hip extension with toe on mat 10x each leg      Moist Heat Therapy   Number Minutes Moist Heat  15 Minutes    Moist Heat Location  Lumbar Spine   supine, and left lower abdominal     Electrical Stimulation   Electrical Stimulation Location  Lumbar    Electrical Stimulation Action  IFC    Electrical Stimulation Parameters  to patient tolerance, 15 mintues    Electrical Stimulation Goals  Pain      Manual Therapy   Manual Therapy  Soft tissue mobilization    Soft tissue mobilization  low thoracic and lumbar paraspinals in prone, and along the lateral trunk       Trigger Point Dry Needling - 04/23/19 0001    Consent Given?  Yes    Education Handout Provided  Previously provided    Muscles Treated Back/Hip  Lumbar  multifidi    Lumbar multifidi Response  Twitch response elicited;Palpable increased muscle length             PT Short Term Goals - 04/17/19 1406      PT SHORT TERM GOAL #2   Title  able to go up and down stairs with >/= 50% greater ease due to improved flexibility    Baseline  40% easier    Time  4    Period  Weeks    Status  On-going    Target Date  02/13/19      PT SHORT TERM GOAL #3   Title  able to cross her legs with >/= 25% greater ease due to reduction of pain    Time  4    Period  Weeks    Status  Achieved        PT Long Term Goals - 04/23/19 1132      PT LONG TERM GOAL #1   Title  independent with advanced HEP    Time  8    Period  Weeks    Status  On-going      PT LONG TERM GOAL #2   Baseline  40% easier    Time  8    Period  Weeks    Status  On-going      PT LONG TERM GOAL #3   Title  cross her legs with greater ease due to improved flexibility in her leg muscles and with >/= 50% less discomfort    Baseline  40% easier    Time  8    Period  Weeks    Status  On-going      PT LONG TERM GOAL #4   Title  lift grocery bags with correct body  mechanics to reduce the strain on her back and legs    Baseline  lift without difficulty withour stairs    Time  8    Period  Weeks    Status  On-going            Plan - 04/23/19 1104    Clinical Impression Statement  Patient had increased in pain in the right lumbar going into the right diaphragm. Patient has done new exercises and tolerated well. Patient had tightness in the lumbar paraspinals. Patient does well having the heat and stimulation after the treatment and works well. Patient is progressing her core exercises. Patient will benefit from skilled therapy to monitor her pain and technique as she progresses her core strength and understand ways to manage pain.    Personal Factors and Comorbidities  Age;Comorbidity 1;Comorbidity 2;Comorbidity 3+;Time since onset of injury/illness/exacerbation    Comorbidities  Osteoporosis, IBS, Fibromyalgia    Examination-Activity Limitations  Stand;Stairs;Squat;Sit    Examination-Participation Restrictions  Meal Prep;Cleaning;Community Activity;Laundry    Stability/Clinical Decision Making  Evolving/Moderate complexity    Rehab Potential  Good    PT Frequency  1x / week    PT Duration  8 weeks    PT Treatment/Interventions  ADLs/Self Care Home Management;Cryotherapy;Electrical Stimulation;Iontophoresis 4mg /ml Dexamethasone;Moist Heat;Ultrasound;Therapeutic exercise;Therapeutic activities;Stair training;Gait training;Neuromuscular re-education;Patient/family education;Dry needling;Manual techniques    PT Next Visit Plan  just do one area of dry needling,  soft tissue work, work on crossing legs, electrical stimulation at the end; check strength, give prone hip ext. with foot on mat and dead bug for [facility]    PT Home Exercise Plan  Access Code: [email or web address]    Consulted and Agree with Plan of Care  Patient  Patient will benefit from skilled therapeutic intervention in order to improve the following deficits and impairments:  Increased fascial  restricitons, Increased muscle spasms, Pain, Decreased strength, Impaired flexibility  Visit Diagnosis: Chronic bilateral low back pain, unspecified whether sciatica present  Muscle weakness (generalized)  Cramp and spasm     Problem List Patient Active Problem List   Diagnosis Date Noted  . Fibromyalgia 09/14/2016  . 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 Chronic serous otitis media of right ear 12/31/2015  . Myalgia 07/18/2012  . Pain in limb 12/20/2011  . Migraine without aura 12/20/2011    Earlie Counts, PT 04/23/19 11:33 AM   [facility] [address] [place], [place], [address] Phone: [phone]   Fax:  [phone]  Name: Brenda Smith MRN: [id number] Date of Birth: 12/15/1950

## 2019-04-30 ENCOUNTER — Ambulatory Visit: Payer: Medicare Other | Admitting: Physical Therapy

## 2019-04-30 ENCOUNTER — Encounter: Payer: Self-pay | Admitting: Physical Therapy

## 2019-04-30 ENCOUNTER — Other Ambulatory Visit: Payer: Self-pay

## 2019-04-30 DIAGNOSIS — M545 Low back pain, unspecified: Secondary | ICD-10-CM

## 2019-04-30 DIAGNOSIS — M6281 Muscle weakness (generalized): Secondary | ICD-10-CM

## 2019-04-30 DIAGNOSIS — G8929 Other chronic pain: Secondary | ICD-10-CM

## 2019-04-30 DIAGNOSIS — R252 Cramp and spasm: Secondary | ICD-10-CM

## 2019-04-30 NOTE — Therapy (Signed)
[facility] [address] [place], [place], [address] Phone: [phone]   Fax:  [phone]  Physical Therapy Treatment  Patient Details  Name: Brenda Smith MRN: [id number] Date of Birth: 08-24-1950 Referring Provider (PT): Dr. Maurice Small   Encounter Date: 04/30/2019  PT End of Session - 04/30/19 1102    Visit Number  12    Date for PT Re-Evaluation  05/08/19    Authorization Type  medicare    PT Start Time  1100    PT Stop Time  1140    PT Time Calculation (min)  40 min    Activity Tolerance  Patient tolerated treatment well;No increased pain    Behavior During Therapy  WFL for tasks assessed/performed       Past Medical History:  Diagnosis Date  . Anxiety disorder   . Fibromyalgia 09/14/2016  . Glaucoma   . IBS (irritable bowel syndrome)   . Low iron   . Migraine headache   . Myalgia and myositis 07/18/2012  . Myalgia and myositis, unspecified     Past Surgical History:  Procedure Laterality Date  . MOHS SURGERY     Squamous cell  . TONSILLECTOMY      There were no vitals filed for this visit.  Subjective Assessment - 04/30/19 1104    Subjective  I am getting my second vaccine shot. I will make this my last day and if I need you in the future.    Patient Stated Goals  cross legs and go up and down stairs    Currently in Pain?  Yes    Pain Score  4     Pain Location  --   right hip and pelvis   Pain Orientation  Right    Pain Descriptors / Indicators  Sore    Pain Type  Chronic pain    Pain Onset  More than a month ago    Pain Frequency  Constant    Aggravating Factors   sitting    Pain Relieving Factors  yoga stretches    Multiple Pain Sites  No         [facility] PT Assessment - 04/30/19 0001      Assessment   Medical Diagnosis  M54.30 Sciatica, unsepecified laterality    Referring Provider (PT)  Dr. Maurice Small    Onset Date/Surgical Date  --   chronic, past 3 years   Prior Therapy   none      Precautions   Precautions  Other (comment)    Precaution Comments  Breast cancer, osteoporosis      Restrictions   Weight Bearing Restrictions  No      [place] residence      Prior Function   Level of Independence  Independent    Leisure  yoga daily, walk every other day      Cognition   Overall Cognitive Status  Within Functional Limits for tasks assessed      Observation/Other Assessments   Focus on Therapeutic Outcomes (FOTO)   43% limitation      Posture/Postural Control   Posture/Postural Control  No significant limitations      AROM   Lumbar Extension  decreased by 25%    Lumbar - Right Rotation  decreased by 25%    Lumbar - Left Rotation  decreased by 25%      Strength   Right Hip Extension  4/5  Right Hip ABduction  4/5    Left Hip Extension  4/5    Left Hip ABduction  4/5    Right Knee Flexion  4/5    Right Knee Extension  5/5    Left Knee Flexion  4/5    Left Knee Extension  5/5      Palpation   SI assessment   pelvis in correct alignment                   [facility] Adult PT Treatment/Exercise - 04/30/19 0001      Modalities   Modalities  Electrical Stimulation;Moist Heat      Moist Heat Therapy   Number Minutes Moist Heat  15 Minutes    Moist Heat Location  Lumbar Spine   supine, and left lower abdominal     Electrical Stimulation   Electrical Stimulation Location  Lumbar    Electrical Stimulation Action  IFC    Electrical Stimulation Parameters  to patient tolerance, 15 minutes    Electrical Stimulation Goals  Pain      Manual Therapy   Manual Therapy  Soft tissue mobilization    Soft tissue mobilization  left gluteal, lumbar paraspinals, around left hip and ilium       Trigger Point Dry Needling - 04/30/19 0001    Consent Given?  Yes    Education Handout Provided  Previously provided    Muscles Treated Back/Hip  Gluteus minimus;Gluteus medius;Gluteus maximus   left   Gluteus  Minimus Response  Twitch response elicited;Palpable increased muscle length    Gluteus Medius Response  Twitch response elicited;Palpable increased muscle length    Gluteus Maximus Response  Palpable increased muscle length;Twitch response elicited             PT Short Term Goals - 04/30/19 1135      PT SHORT TERM GOAL #1   Title  independent with initial HEP    Time  4    Period  Weeks    Status  Achieved    Target Date  02/13/19      PT SHORT TERM GOAL #2   Title  able to go up and down stairs with >/= 50% greater ease due to improved flexibility    Time  4    Period  Weeks    Status  Achieved      PT SHORT TERM GOAL #3   Title  able to cross her legs with >/= 25% greater ease due to reduction of pain    Time  4    Period  Weeks    Status  Achieved    Target Date  02/13/19        PT Long Term Goals - 04/30/19 1136      PT LONG TERM GOAL #1   Title  independent with advanced HEP    Time  8    Period  Weeks    Status  Achieved      PT LONG TERM GOAL #2   Title  [place] >/= 70%    Time  8    Period  Weeks    Status  Achieved      PT LONG TERM GOAL #3   Title  cross her legs with greater ease due to improved flexibility in her leg muscles and with >/= 50% less discomfort    Time  8    Period  Weeks    Status  Achieved      PT LONG TERM GOAL #4   Title  lift grocery bags with correct body mechanics to reduce the strain on her back and legs    Time  8    Period  Weeks    Status  Achieved      PT LONG TERM GOAL #5   Title  FOTO score </= 41 % limitation    Baseline  43%    Time  8    Period  Weeks    Status  Not Met            Plan - 04/30/19 1137    Clinical Impression Statement  Patient has met all of her goals except for the FOTO score, it is 43% instead of 41% limitation. Patient is doing her yoga and workout at the gym. Patient understands how to manage her pain and understand  when she may have to rest. Patient is able to go up and down stairs with 70% greater ease. Patient is able to cross her legs with 50% greater ease. Patient is independent with her HEP. Patient is ready for discharge.    Personal Factors and Comorbidities  Age;Comorbidity 1;Comorbidity 2;Comorbidity 3+;Time since onset of injury/illness/exacerbation    Comorbidities  Osteoporosis, IBS, Fibromyalgia    Examination-Activity Limitations  Stand;Stairs;Squat;Sit    Examination-Participation Restrictions  Meal Prep;Cleaning;Community Activity;Laundry    Stability/Clinical Decision Making  Evolving/Moderate complexity    Rehab Potential  Good    PT Treatment/Interventions  ADLs/Self Care Home Management;Cryotherapy;Electrical Stimulation;Iontophoresis '4mg'$ /ml Dexamethasone;Moist Heat;Ultrasound;Therapeutic exercise;Therapeutic activities;Stair training;Gait training;Neuromuscular re-education;Patient/family education;Dry needling;Manual techniques    PT Next Visit Plan  Discharge to HEP    PT Home Exercise Plan  Access Code: [email or web address]    Consulted and Agree with Plan of Care  Patient       Patient will benefit from skilled therapeutic intervention in order to improve the following deficits and impairments:  Increased fascial restricitons, Increased muscle spasms, Pain, Decreased strength, Impaired flexibility  Visit Diagnosis: Muscle weakness (generalized)  Chronic bilateral low back pain, unspecified whether sciatica present  Cramp and spasm     Problem List Patient Active Problem List   Diagnosis Date Noted  . Fibromyalgia 09/14/2016  . 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 Chronic serous otitis media of right ear 12/31/2015  . Myalgia 07/18/2012  . Pain in limb 12/20/2011  . Migraine without aura 12/20/2011    Earlie Counts, PT 04/30/19 11:40 AM   [facility] [address], [place], [place], [address] Phone: [phone]   Fax:  [phone]  Name: Brenda Smith MRN: [id number] Date of Birth: March 18, 1950  PHYSICAL THERAPY DISCHARGE SUMMARY  Visits from Start of Care: 12  Current functional level related to goals / functional outcomes: See above.   Remaining deficits: See above.    Education / Equipment: HEP Plan: Patient agrees to discharge.  Patient goals were partially met. Patient is being discharged due to being pleased with the current functional level.  Thank you for the referral. Earlie Counts, PT 04/30/19 11:41 AM  ?????

## 2019-05-07 ENCOUNTER — Ambulatory Visit: Payer: Medicare Other | Attending: Internal Medicine

## 2019-05-07 DIAGNOSIS — Z23 Encounter for immunization: Secondary | ICD-10-CM

## 2019-05-07 NOTE — Progress Notes (Signed)
   Covid-19 Vaccination Clinic  Name:  Brenda Smith    MRN: [id number] DOB: Oct 14, 1950  05/07/2019  [name] was observed post Covid-19 immunization for 15 minutes without incident. She was provided with Vaccine Information Sheet and instruction to access the V-Safe system.   [name] was instructed to call 911 with any severe reactions post vaccine: Marland Kitchen Difficulty breathing  . Swelling of face and throat  . A fast heartbeat  . A bad rash all over body  . Dizziness and weakness   Immunizations Administered    Name Date Dose VIS Date Route   Pfizer COVID-19 Vaccine 05/07/2019  9:46 AM 0.3 mL 02/15/2019 Intramuscular   Manufacturer: [place]   Lot: [id number]   [place]: [id number]

## 2019-07-31 ENCOUNTER — Encounter: Payer: Self-pay | Admitting: Neurology

## 2019-09-04 ENCOUNTER — Other Ambulatory Visit: Payer: Self-pay | Admitting: Gastroenterology

## 2019-09-04 DIAGNOSIS — R1310 Dysphagia, unspecified: Secondary | ICD-10-CM

## 2019-09-10 ENCOUNTER — Ambulatory Visit: Payer: Medicare Other | Admitting: Neurology

## 2019-09-13 ENCOUNTER — Ambulatory Visit
Admission: RE | Admit: 2019-09-13 | Discharge: 2019-09-13 | Disposition: A | Discharge status: Home / Self Care | Payer: Medicare Other | Source: Ambulatory Visit | Attending: Gastroenterology | Admitting: Gastroenterology

## 2019-09-13 DIAGNOSIS — R1310 Dysphagia, unspecified: Secondary | ICD-10-CM

## 2020-04-20 DIAGNOSIS — E7800 Pure hypercholesterolemia, unspecified: Secondary | ICD-10-CM | POA: Diagnosis not present

## 2020-04-20 DIAGNOSIS — E559 Vitamin D deficiency, unspecified: Secondary | ICD-10-CM | POA: Diagnosis not present

## 2020-04-20 DIAGNOSIS — E611 Iron deficiency: Secondary | ICD-10-CM | POA: Diagnosis not present

## 2020-04-20 DIAGNOSIS — E538 Deficiency of other specified B group vitamins: Secondary | ICD-10-CM | POA: Diagnosis not present

## 2020-04-20 DIAGNOSIS — R7303 Prediabetes: Secondary | ICD-10-CM | POA: Diagnosis not present

## 2020-04-20 DIAGNOSIS — R5382 Chronic fatigue, unspecified: Secondary | ICD-10-CM | POA: Diagnosis not present

## 2020-05-11 DIAGNOSIS — H401131 Primary open-angle glaucoma, bilateral, mild stage: Secondary | ICD-10-CM | POA: Diagnosis not present

## 2020-05-12 DIAGNOSIS — K219 Gastro-esophageal reflux disease without esophagitis: Secondary | ICD-10-CM | POA: Diagnosis not present

## 2020-05-12 DIAGNOSIS — Z86010 Personal history of colonic polyps: Secondary | ICD-10-CM | POA: Diagnosis not present

## 2020-05-12 DIAGNOSIS — K5904 Chronic idiopathic constipation: Secondary | ICD-10-CM | POA: Diagnosis not present

## 2020-05-12 DIAGNOSIS — Z1211 Encounter for screening for malignant neoplasm of colon: Secondary | ICD-10-CM | POA: Diagnosis not present

## 2020-05-12 DIAGNOSIS — R1032 Left lower quadrant pain: Secondary | ICD-10-CM | POA: Diagnosis not present

## 2020-07-15 DIAGNOSIS — K2100 Gastro-esophageal reflux disease with esophagitis, without bleeding: Secondary | ICD-10-CM | POA: Diagnosis not present

## 2020-07-15 DIAGNOSIS — K2289 Other specified disease of esophagus: Secondary | ICD-10-CM | POA: Diagnosis not present

## 2020-07-15 DIAGNOSIS — K209 Esophagitis, unspecified without bleeding: Secondary | ICD-10-CM | POA: Diagnosis not present

## 2020-07-15 DIAGNOSIS — Z1211 Encounter for screening for malignant neoplasm of colon: Secondary | ICD-10-CM | POA: Diagnosis not present

## 2020-07-15 DIAGNOSIS — Z86010 Personal history of colonic polyps: Secondary | ICD-10-CM | POA: Diagnosis not present

## 2020-07-15 DIAGNOSIS — K2101 Gastro-esophageal reflux disease with esophagitis, with bleeding: Secondary | ICD-10-CM | POA: Diagnosis not present

## 2020-07-15 DIAGNOSIS — R1310 Dysphagia, unspecified: Secondary | ICD-10-CM | POA: Diagnosis not present

## 2020-07-15 DIAGNOSIS — K635 Polyp of colon: Secondary | ICD-10-CM | POA: Diagnosis not present

## 2020-07-15 DIAGNOSIS — D124 Benign neoplasm of descending colon: Secondary | ICD-10-CM | POA: Diagnosis not present

## 2020-08-10 ENCOUNTER — Encounter: Payer: Self-pay | Admitting: Physical Therapy

## 2020-08-10 ENCOUNTER — Other Ambulatory Visit: Payer: Self-pay

## 2020-08-10 ENCOUNTER — Ambulatory Visit: Payer: Medicare Other | Admitting: Physical Therapy

## 2020-08-10 DIAGNOSIS — R252 Cramp and spasm: Secondary | ICD-10-CM

## 2020-08-10 DIAGNOSIS — G8929 Other chronic pain: Secondary | ICD-10-CM | POA: Diagnosis not present

## 2020-08-10 DIAGNOSIS — M545 Low back pain, unspecified: Secondary | ICD-10-CM

## 2020-08-10 DIAGNOSIS — M6281 Muscle weakness (generalized): Secondary | ICD-10-CM | POA: Diagnosis not present

## 2020-08-10 NOTE — Therapy (Signed)
[place], [place], [address] Phone: [phone]   Fax:  [phone]  Physical Therapy Evaluation  Patient Details  Name: Brenda Smith MRN: [id number] Date of Birth: April 25, 1950 Referring Provider (PT): Maurice Small, MD   Encounter Date: 08/10/2020   PT End of Session - 08/10/20 1246    Visit Number 1    Number of Visits 6    Date for PT Re-Evaluation 09/21/20    Authorization Type [employer] Medicare    Progress Note Due on Visit 10    PT Start Time 1140    PT Stop Time 1220    PT Time Calculation (min) 40 min    Activity Tolerance Patient tolerated treatment well    Behavior During Therapy [facility] for tasks assessed/performed           Past Medical History:  Diagnosis Date  . Anxiety disorder   . Fibromyalgia 09/14/2016  . Glaucoma   . IBS (irritable bowel syndrome)   . Low iron   . Migraine headache   . Myalgia and myositis 07/18/2012  . Myalgia and myositis, unspecified     Past Surgical History:  Procedure Laterality Date  . MOHS SURGERY     Squamous cell  . TONSILLECTOMY      There were no vitals filed for this visit.    Subjective Assessment - 08/10/20 1145    Subjective Pt is a 70 y/o female who presents to OPPT for chronic LBP with radiating pain down legs.  She reports since vaccine (end of Jan) pain also refers up her back to scapulae.  She feels the foam roller at [employer] helps, and dry needling with Earlie Counts which was helpful.    Limitations Sitting    How long can you sit comfortably? about 90 min    Patient Stated Goals improve pain, decrease spasms    Currently in Pain? Yes    Pain Score 6    up to 8/10; at best 0/10   Pain Location Back    Pain Orientation Mid;Lower;Right;Left    Pain Descriptors / Indicators Radiating;Spasm;Shooting    Pain Type Acute pain;Chronic pain    Pain Onset More than a month ago    Pain Frequency Intermittent    Aggravating Factors  lifting too much,  repetitive yard work    Pain Relieving Factors massages, foam roller, icing, stretching              [facility] PT Assessment - 08/10/20 1139      Assessment   Medical Diagnosis G89.29 - Other Chronic Pain  M54.50 - Low Back Pain, Unspecified    Referring Provider (PT) Maurice Small, MD    Onset Date/Surgical Date --   chronic, acute exacerbation x 6 months   Hand Dominance Right    Next MD Visit every 6 months    Prior Therapy Feb 2021 at [facility]      Precautions   Precautions None      Restrictions   Weight Bearing Restrictions No      Balance Screen   Has the patient fallen in the past 6 months No    Has the patient had a decrease in activity level because of a fear of falling?  No    Is the patient reluctant to leave their home because of a fear of falling?  No      [place] residence    Living Arrangements Spouse/significant other  Type of Monument to enter    Entrance Stairs-Number of Steps 1    Home Layout One level      Prior Function   Level of Independence Independent    Vocation Retired    [employer] retired from Print[employer]    Leisure regular exercise at [employer]; gardening, bird feeders/bird watching      Cognition   Overall Cognitive Status Within Functional Limits for tasks assessed      Observation/Other Assessments   Focus on Therapeutic Outcomes (FOTO)  54 (predicted 62)      Posture/Postural Control   Posture/Postural Control Postural limitations    Postural Limitations Rounded Shoulders;Forward head      ROM / Strength   AROM / PROM / Strength AROM;Strength      AROM   AROM Assessment Site Lumbar    Lumbar Flexion limited 25%    Lumbar Extension WNL    Lumbar - Right Side Bend WNL    Lumbar - Left Side Bend WNL    Lumbar - Right Rotation WNL    Lumbar - Left Rotation WNL      Strength   Overall Strength Comments Lt shoulder 3/5 with pain with testing       Palpation   Palpation comment trigger points noted in Lt rhomboids, subscap and bil glute med                      Objective measurements completed on examination: See above findings.       [facility] Adult PT Treatment/Exercise - 08/10/20 1139      Manual Therapy   Manual Therapy Soft tissue mobilization    Manual therapy comments skilled palpation and monitoring of soft tissue during DN    Soft tissue mobilization bil glute med with compression; Lt rhomboids            Trigger Point Dry Needling - 08/10/20 1245    Consent Given? Yes    Education Handout Provided Previously provided    Muscles Treated Upper Quadrant Subscapularis;Rhomboids    Muscles Treated Back/Hip Gluteus medius    Rhomboids Response Twitch response elicited    Subscapularis Response Twitch response elicited    Gluteus Medius Response Twitch response elicited                PT Education - 08/10/20 1246    Education Details POC    Person(s) Educated Patient    Methods Explanation    Comprehension Verbalized understanding            PT Short Term Goals - 08/10/20 1349      PT SHORT TERM GOAL #1   Title independent with initial HEP    Time 3    Period Weeks    Status New    Target Date 08/31/20      PT SHORT TERM GOAL #2   Title n/a      PT SHORT TERM GOAL #3   Title n/a             PT Long Term Goals - 08/10/20 1349      PT LONG TERM GOAL #1   Title independent with advanced HEP    Time 6    Period Weeks    Status New    Target Date 09/21/20      PT LONG TERM GOAL #2   Title FOTO score improved to 65 for improved function  Time 6    Period Weeks    Status New    Target Date 09/21/20      PT LONG TERM GOAL #3   Title report pain < 3/10 with activity for improved function    Time 6    Period Weeks    Status New    Target Date 09/21/20      PT LONG TERM GOAL #4   Title demonstrate 4/5 Lt shoulder strength for improved function    Time 6    Period  Weeks    Status New    Target Date 09/21/20      PT LONG TERM GOAL #5   Title n/a                  Plan - 08/10/20 1247    Clinical Impression Statement Pt is a 70 y/o female who presents to OPPT for acute exacerbation of chronic LBP, with pain now radiating up into the upper back as well.  She demonstrates postural abnormalties, decreased strength, and trigger points affecting functional mobility. She will benefit from PT to address deficits listed.    Personal Factors and Comorbidities Comorbidity 3+    Comorbidities anxiety, fibromyalgia, glaucoma, migraines    Examination-Activity Limitations Stand;Squat;Carry;Dressing;Lift;Reach Overhead    Examination-Participation Restrictions Cleaning;Community Activity;Yard Work    [employer]chant [employer] officer Evolving/Moderate complexity    Clinical Decision Making Moderate    Rehab Potential Good    PT Frequency 1x / week    PT Duration 6 weeks    PT Treatment/Interventions ADLs/Self Care Home Management;Aquatic Therapy;Cryotherapy;Electrical Stimulation;Moist Heat;Traction;Therapeutic exercise;Therapeutic activities;Functional mobility training;DME Instruction;Neuromuscular re-education;Patient/family education;Manual techniques;Taping;Dry needling;Spinal Manipulations    PT Next Visit Plan establish gym HEP for gentle UE strengthening, DN/Manual PRN    Consulted and Agree with Plan of Care Patient           Patient will benefit from skilled therapeutic intervention in order to improve the following deficits and impairments:  Impaired tone,Increased fascial restricitons,Increased muscle spasms,Decreased strength,Decreased range of motion,Postural dysfunction,Pain  Visit Diagnosis: Muscle weakness (generalized) - Plan: PT plan of care cert/re-cert  Chronic bilateral low back pain, unspecified whether sciatica present - Plan: PT plan of care cert/re-cert  Cramp and spasm - Plan: PT plan of care cert/re-cert     Problem  List Patient Active Problem List   Diagnosis Date Noted  . Fibromyalgia 09/14/2016  . 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 Chronic serous otitis media of right ear 12/31/2015  . Myalgia 07/18/2012  . Pain in limb 12/20/2011  . Migraine without aura 12/20/2011      Laureen Abrahams, PT, DPT 08/10/20 1:54 PM     [facility] [address] [place], [place], [address] Phone: [phone]   Fax:  [phone]  Name: Brenda Smith MRN: [id number] Date of Birth: [birth year]

## 2020-08-13 DIAGNOSIS — Z86010 Personal history of colonic polyps: Secondary | ICD-10-CM | POA: Diagnosis not present

## 2020-08-13 DIAGNOSIS — K2100 Gastro-esophageal reflux disease with esophagitis, without bleeding: Secondary | ICD-10-CM | POA: Diagnosis not present

## 2020-08-13 DIAGNOSIS — K5904 Chronic idiopathic constipation: Secondary | ICD-10-CM | POA: Diagnosis not present

## 2020-08-13 LAB — HM COLONOSCOPY

## 2020-08-17 ENCOUNTER — Other Ambulatory Visit: Payer: Self-pay

## 2020-08-17 ENCOUNTER — Ambulatory Visit: Payer: Medicare Other | Admitting: Physical Therapy

## 2020-08-17 DIAGNOSIS — M545 Low back pain, unspecified: Secondary | ICD-10-CM | POA: Diagnosis not present

## 2020-08-17 DIAGNOSIS — M6281 Muscle weakness (generalized): Secondary | ICD-10-CM

## 2020-08-17 DIAGNOSIS — R252 Cramp and spasm: Secondary | ICD-10-CM | POA: Diagnosis not present

## 2020-08-17 DIAGNOSIS — G8929 Other chronic pain: Secondary | ICD-10-CM

## 2020-08-17 NOTE — Patient Instructions (Signed)
Access Code: [email or web address] URL: [email or web address] Date: 08/17/2020 Prepared by: Faustino Congress  Exercises Recumbent Bike - 1 x daily - 7 x weekly - 1 sets - 1 reps Seated Arm Bike - 1 x daily - 7 x weekly - 1 sets - 1 reps Standing Shoulder Flexion to 90 Degrees with Dumbbells - 1 x daily - 7 x weekly - 3 sets - 10 reps Shoulder Abduction with Dumbbells - Thumbs Up - 1 x daily - 7 x weekly - 3 sets - 10 reps Standing Bent Over Bilateral Shoulder Row with Dumbbells - 1 x daily - 7 x weekly - 3 sets - 10 reps Supine Bridge - 1 x daily - 7 x weekly - 3 sets - 10 reps - 5 sec hold Clamshell - 1 x daily - 7 x weekly - 3 sets - 10 reps Supine Piriformis Stretch with Foot on Ground - 1 x daily - 7 x weekly - 1 sets - 3 reps - 30 sec hold Hooklying Single Knee to Chest Stretch - 1 x daily - 7 x weekly - 1 sets - 3 reps - 30 sec hold
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
## 2020-08-24 ENCOUNTER — Encounter: Payer: Self-pay | Admitting: Physical Therapy

## 2020-09-01 ENCOUNTER — Encounter: Payer: Self-pay | Admitting: Physical Therapy

## 2020-10-30 DIAGNOSIS — M940 Chondrocostal junction syndrome [Tietze]: Secondary | ICD-10-CM | POA: Diagnosis not present

## 2020-11-06 DIAGNOSIS — D225 Melanocytic nevi of trunk: Secondary | ICD-10-CM | POA: Diagnosis not present

## 2020-11-06 DIAGNOSIS — L570 Actinic keratosis: Secondary | ICD-10-CM | POA: Diagnosis not present

## 2020-11-06 DIAGNOSIS — L578 Other skin changes due to chronic exposure to nonionizing radiation: Secondary | ICD-10-CM | POA: Diagnosis not present

## 2020-11-06 DIAGNOSIS — L821 Other seborrheic keratosis: Secondary | ICD-10-CM | POA: Diagnosis not present

## 2020-11-11 DIAGNOSIS — H401131 Primary open-angle glaucoma, bilateral, mild stage: Secondary | ICD-10-CM | POA: Diagnosis not present

## 2020-11-25 DIAGNOSIS — M816 Localized osteoporosis [Lequesne]: Secondary | ICD-10-CM | POA: Diagnosis not present

## 2020-11-25 DIAGNOSIS — R29890 Loss of height: Secondary | ICD-10-CM | POA: Diagnosis not present

## 2020-11-25 DIAGNOSIS — K219 Gastro-esophageal reflux disease without esophagitis: Secondary | ICD-10-CM | POA: Diagnosis not present

## 2020-12-01 DIAGNOSIS — Z23 Encounter for immunization: Secondary | ICD-10-CM | POA: Diagnosis not present

## 2020-12-18 DIAGNOSIS — Z1231 Encounter for screening mammogram for malignant neoplasm of breast: Secondary | ICD-10-CM | POA: Diagnosis not present

## 2020-12-18 LAB — HM MAMMOGRAPHY

## 2021-02-08 ENCOUNTER — Other Ambulatory Visit: Payer: Self-pay

## 2021-02-08 ENCOUNTER — Ambulatory Visit: Payer: Medicare Other | Admitting: Orthopedic Surgery

## 2021-02-08 ENCOUNTER — Ambulatory Visit: Payer: Self-pay

## 2021-02-08 DIAGNOSIS — M7502 Adhesive capsulitis of left shoulder: Secondary | ICD-10-CM

## 2021-02-08 DIAGNOSIS — M79602 Pain in left arm: Secondary | ICD-10-CM | POA: Diagnosis not present

## 2021-02-10 ENCOUNTER — Telehealth: Payer: Self-pay | Admitting: Surgical

## 2021-02-11 ENCOUNTER — Telehealth: Payer: Self-pay | Admitting: Orthopedic Surgery

## 2021-02-11 DIAGNOSIS — R7303 Prediabetes: Secondary | ICD-10-CM | POA: Diagnosis not present

## 2021-02-11 DIAGNOSIS — G4700 Insomnia, unspecified: Secondary | ICD-10-CM | POA: Diagnosis not present

## 2021-02-11 DIAGNOSIS — E7800 Pure hypercholesterolemia, unspecified: Secondary | ICD-10-CM | POA: Diagnosis not present

## 2021-02-11 DIAGNOSIS — J4520 Mild intermittent asthma, uncomplicated: Secondary | ICD-10-CM | POA: Diagnosis not present

## 2021-02-11 DIAGNOSIS — Z79899 Other long term (current) drug therapy: Secondary | ICD-10-CM | POA: Diagnosis not present

## 2021-02-11 DIAGNOSIS — E559 Vitamin D deficiency, unspecified: Secondary | ICD-10-CM | POA: Diagnosis not present

## 2021-02-11 DIAGNOSIS — M810 Age-related osteoporosis without current pathological fracture: Secondary | ICD-10-CM | POA: Diagnosis not present

## 2021-02-11 DIAGNOSIS — Z0001 Encounter for general adult medical examination with abnormal findings: Secondary | ICD-10-CM | POA: Diagnosis not present

## 2021-02-11 DIAGNOSIS — R001 Bradycardia, unspecified: Secondary | ICD-10-CM | POA: Diagnosis not present

## 2021-02-11 NOTE — Telephone Encounter (Signed)
Received vm from pt asking for something showing she was seen  yesterday to be emailed to her. IC pt,lmvm adivsed I emailed AVS to her [email or web address] [phone]

## 2021-02-14 ENCOUNTER — Encounter: Payer: Self-pay | Admitting: Orthopedic Surgery

## 2021-02-14 DIAGNOSIS — M7502 Adhesive capsulitis of left shoulder: Secondary | ICD-10-CM | POA: Diagnosis not present

## 2021-02-14 MED ORDER — LIDOCAINE HCL 1 % IJ SOLN
5.0000 mL | INTRAMUSCULAR | Status: AC | PRN
  Administered 2021-02-14: 5 mL

## 2021-02-14 MED ORDER — BUPIVACAINE HCL 0.5 % IJ SOLN
9.0000 mL | INTRAMUSCULAR | Status: AC | PRN
  Administered 2021-02-14: 9 mL via INTRA_ARTICULAR

## 2021-02-14 MED ORDER — METHYLPREDNISOLONE ACETATE 40 MG/ML IJ SUSP
40.0000 mg | INTRAMUSCULAR | Status: AC | PRN
  Administered 2021-02-14: 40 mg via INTRA_ARTICULAR

## 2021-02-14 NOTE — Progress Notes (Signed)
Office Visit Note   Patient: Brenda Smith           Date of Birth: 17-Jul-1950           MRN: [id number] Visit Date: 02/08/2021 Requested by: No referring provider defined for this encounter. PCP: Maurice Small, MD (Inactive)  Subjective: Chief Complaint  Patient presents with   Left Shoulder - Pain    HPI: Brenda Smith is a 70 y.o. female who presents to the office complaining of left shoulder pain.  Patient states that she had left shoulder pain that began in the early spring and has progressively worsened.  She localizes pain to the anterior lateral aspect of the shoulder with occasional radiation to the elbow.  She notes insidious onset of pain.  She states that reaching out and across her body is the most painful for her.  She does occasionally wake with pain and she is not able to really lay on her left side due to the pain.  She is right-hand dominant but it does a lot of things left-handed.  She played tennis for 35 to 40 years but has given this up.  Denies any history of diabetes or smoking but does have a history of fibromyalgia.  No previous surgery on left shoulder or cervical spine but she does have history of right tennis elbow surgery by Dr. Durward Fortes previously.  No right-sided symptoms.  No significant neck pain.  Occasional scapular pain and she does also note pain in her shoulder blade.  She also notes decreased range of motion, particularly when she goes to the health club and tries to do some stretching exercises..                ROS: All systems reviewed are negative as they relate to the chief complaint within the history of present illness.  Patient denies fevers or chills.  Assessment & Plan: Visit Diagnoses:  1. Left arm pain     Plan: Patient is a 70 year old female who presents for evaluation of left shoulder pain.  She has had insidious onset that has been steadily worsening over the last several months since spring 2022.  She has excellent strength on  exam.  No significant findings on her radiographs aside from some mild to moderate degenerative changes of the cervical spine.  No significant concern for cervical radiculopathy at today's visit.  She does have asymmetric loss of range of motion on her symptomatic shoulder that is concerning for adhesive capsulitis.  Discussed options available to patient.  After discussion of the pathology and the treatment options, patient would like to proceed with left shoulder glenohumeral injection today and a shoulder home exercise program that she will complete 1-2 times daily over the next 6 weeks.  Follow-up in 6 weeks for clinical recheck.  She tolerated the injection very well.  Call with any concerns in the meantime.  Follow-Up Instructions: No follow-ups on file.   Orders:  Orders Placed This Encounter  Procedures   XR Shoulder Left   XR Cervical Spine 2 or 3 views   No orders of the defined types were placed in this encounter.     Procedures: Large Joint Inj: L glenohumeral on 02/14/2021 8:34 PM Indications: diagnostic evaluation and pain Details: 18 G 1.5 in needle, posterior approach  Arthrogram: No  Medications: 9 mL bupivacaine 0.5 %; 40 mg methylPREDNISolone acetate 40 MG/ML; 5 mL lidocaine 1 % Outcome: tolerated well, no immediate complications Procedure, treatment alternatives, risks and benefits explained,  specific risks discussed. Consent was given by the patient. Immediately prior to procedure a time out was called to verify the correct patient, procedure, equipment, support staff and site/side marked as required. Patient was prepped and draped in the usual sterile fashion.      Clinical Data: No additional findings.  Objective: Vital Signs: There were no vitals taken for this visit.  Physical Exam:  Constitutional: Patient appears well-developed HEENT:  Head: Normocephalic Eyes:EOM are normal Neck: Normal range of motion Cardiovascular: Normal rate Pulmonary/chest:  Effort normal Neurologic: Patient is alert Skin: Skin is warm Psychiatric: Patient has normal mood and affect  Ortho Exam: Ortho exam demonstrates left shoulder with 75 degrees external rotation, 70 degrees abduction, 145 degrees forward flexion.  This compared with the right shoulder with 75 degrees external rotation, 95 degrees abduction, 170 degrees forward flexion.  She has a marked decrease of internal rotation of the left shoulder compared to right.  Painful passive motion of the shoulder.  No crepitus noted with passive motion of the left shoulder.  5/5 motor strength of bilateral supraspinatus, infraspinatus, subscapularis.  No weakness with finger abduction, grip strength, finger adduction, bicep, tricep, deltoid, supination, pronation.  No significant pain with cervical spine range of motion.  Mild to moderate tenderness over the bicipital groove.  No tenderness over the [facility] [place] joint.  Specialty Comments:  No specialty comments available.  Imaging: No results found.   PMFS History: Patient Active Problem List   Diagnosis Date Noted   Fibromyalgia 09/14/2016   Mixed conductive and 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 12/31/2015   Chronic serous otitis media of right ear 12/31/2015   Myalgia 07/18/2012   Pain in limb 12/20/2011   Migraine without aura 12/20/2011   Past Medical History:  Diagnosis Date   Anxiety disorder    Fibromyalgia 09/14/2016   Glaucoma    IBS (irritable bowel syndrome)    Low iron    Migraine headache    Myalgia and myositis 07/18/2012   Myalgia and myositis, unspecified     Family History  Problem Relation Age of Onset   Heart attack Mother    Cancer Mother    Heart attack Father    Gout Father    Anxiety disorder Father    Heart disease Brother    Headache Maternal Grandmother    Heart disease Maternal Grandmother     Past Surgical History:  Procedure Laterality Date   MOHS SURGERY     Squamous cell    TONSILLECTOMY     Social History   Occupational History   Not on file  Tobacco Use   Smoking status: Never   Smokeless tobacco: Never  Substance and Sexual Activity   Alcohol use: Yes    Comment: Drinks wine on occasion   Drug use: No   Sexual activity: Yes    Partners: Male    Birth control/protection: None

## 2021-03-14 NOTE — Progress Notes (Signed)
Cardiology Office Note   Date:  03/15/2021   ID:  Brenda Smith, DOB May 03, 1950, MRN [id number]  PCP:  Stephani Police, MD  Cardiologist:   Minus Breeding, MD Referring:  Lujean Amel, MD  Chief Complaint  Patient presents with   Bradycardia      History of Present Illness: Brenda Smith is a 71 y.o. female who was referred by Lujean Amel, MD for evaluation of bradycardia.  She has no past cardiac history.  She was noted to have bradycardia with a heart rate of 45 in December.  However, she has not really have any symptoms with this.  She walks routinely for exercise and she says she gets heart rate up to 70 or 80.  She is not breathing hard at that point.  She is not having other cardiovascular symptoms.  She denies chest pressure, neck or arm discomfort.  She has never had palpitations, presyncope or syncope.  She has no PND or orthopnea.  She is never had any other cardiac work-up or testing.  She does have a family history of heart disease but she has no other risk factors.   Past Medical History:  Diagnosis Date   Anxiety disorder    Fibromyalgia 09/14/2016   Glaucoma    IBS (irritable bowel syndrome)    Low iron    Migraine headache    Myalgia and myositis, unspecified     Past Surgical History:  Procedure Laterality Date   MOHS SURGERY     Squamous cell   TONSILLECTOMY       Current Outpatient Medications  Medication Sig Dispense Refill   brimonidine (ALPHAGAN P) 0.1 % SOLN Place 1 drop into both eyes 2 (two) times daily.     HYDROcodone-acetaminophen (NORCO) 10-325 MG per tablet Take 1 tablet by mouth every 6 (six) hours as needed for pain.     loratadine (CLARITIN) 10 MG tablet Take 10 mg by mouth.     LORazepam (ATIVAN) 0.5 MG tablet Take 0.5 mg by mouth 2 (two) times daily.     PROAIR HFA 108 (90 Base) MCG/ACT inhaler INHALE 1 PUFF EVERY 4 HOURS AS NEEDED FOR WHEEZING/SHORTNESS OF BREATH  5   sucralfate (CARAFATE) 1 g tablet Take 1 g by mouth 4  (four) times daily -  with meals and at bedtime.     tiZANidine (ZANAFLEX) 2 MG tablet Take 1 tablet (2 mg total) by mouth every 6 (six) hours as needed for muscle spasms. 90 tablet 3   No current facility-administered medications for this visit.    Allergies:   Penicillins, Savella [milnacipran hcl], Pamelor [nortriptyline hcl], and Topamax [topiramate]    Social History:  The patient  reports that she has never smoked. She has never used smokeless tobacco. She reports current alcohol use. She reports that she does not use drugs.   Family History:  The patient's family history includes Anxiety disorder in her father; CAD (age of onset: 46) in her brother; Cancer in her mother; Gout in her father; Headache in her maternal grandmother; Heart attack (age of onset: 4) in her father; Heart attack (age of onset: 52) in her mother; Heart disease in her maternal grandmother.    ROS:  Please see the history of present illness.   Otherwise, review of systems are positive for none.   All other systems are reviewed and negative.    PHYSICAL EXAM: VS:  BP 118/70 (BP Location: Left Arm, Patient Position: Sitting, Cuff Size: Normal)  Pulse (!) 54    Ht 5' 4.5" (1.638 m)    Wt 138 lb (62.6 kg)    BMI 23.32 kg/m  , BMI Body mass index is 23.32 kg/m. GENERAL:  Well appearing HEENT:  Pupils equal round and reactive, fundi not visualized, oral mucosa unremarkable NECK:  No jugular venous distention, waveform within normal limits, carotid upstroke brisk and symmetric, no bruits, no thyromegaly LYMPHATICS:  No cervical, inguinal adenopathy LUNGS:  Clear to auscultation bilaterally BACK:  No CVA tenderness CHEST:  Unremarkable HEART:  PMI not displaced or sustained,S1 and S2 within normal limits, no S3, no S4, no clicks, no rubs, no murmurs ABD:  Flat, positive bowel sounds normal in frequency in pitch, no bruits, no rebound, no guarding, no midline pulsatile mass, no hepatomegaly, no splenomegaly EXT:  2  plus pulses throughout, no edema, no cyanosis no clubbing SKIN:  No rashes no nodules NEURO:  Cranial nerves II through XII grossly intact, motor grossly intact throughout PSYCH:  Cognitively intact, oriented to person place and time    EKG:  EKG is not ordered today. The ekg ordered 02/11/2021 demonstrates sinus bradycardia, rate 45, axis within normal limits, intervals within normal limits, no acute ST-T wave changes.   Recent Labs: No results found for requested labs within last 8760 hours.    Lipid Panel No results found for: CHOL, TRIG, HDL, CHOLHDL, VLDL, LDLCALC, LDLDIRECT    Wt Readings from Last 3 Encounters:  03/15/21 138 lb (62.6 kg)  01/07/19 146 lb 12.8 oz (66.6 kg)  09/25/17 137 lb 3.2 oz (62.2 kg)      Other studies Reviewed: Additional studies/ records that were reviewed today include: Office records. Review of the above records demonstrates:  Please see elsewhere in the note.     ASSESSMENT AND PLAN:  BRADYCARDIA:   Her heart rate does not cause any symptoms.  She is no indication for pacing.  She will watch her heart rate with a pulse oximeter and will check it with exercise I have asked her to push herself and record her heart rate.  She has no conduction abnormalities on EKG other than the sinus bradycardia.  I do not suspect any further intervention or work-up will be necessary.  DYSLIPIDEMIA: Goals of therapy will be based on below.  FAMILY HISTORY OF CAD: The patient has a strong family history of coronary artery disease.  I would like to screen her with a coronary calcium score.  I would actually have a low threshold for POET (Plain Old Exercise Treadmill)  given this result and her dyslipidemia.  Current medicines are reviewed at length with the patient today.  The patient does not have concerns regarding medicines.  The following changes have been made:  no change  Labs/ tests ordered today include:   Orders Placed This Encounter  Procedures    CT CARDIAC SCORING (SELF PAY ONLY)     Disposition:   FU with me based on the results of the above.     Signed, Minus Breeding, MD  03/15/2021 9:57 AM    [facility]

## 2021-03-15 ENCOUNTER — Other Ambulatory Visit: Payer: Self-pay

## 2021-03-15 ENCOUNTER — Ambulatory Visit: Payer: Medicare Other | Admitting: Cardiology

## 2021-03-15 ENCOUNTER — Encounter: Payer: Self-pay | Admitting: Cardiology

## 2021-03-15 VITALS — BP 118/70 | HR 54 | Ht 64.5 in | Wt 138.0 lb

## 2021-03-15 DIAGNOSIS — R001 Bradycardia, unspecified: Secondary | ICD-10-CM | POA: Diagnosis not present

## 2021-03-15 DIAGNOSIS — E785 Hyperlipidemia, unspecified: Secondary | ICD-10-CM | POA: Diagnosis not present

## 2021-03-15 NOTE — Patient Instructions (Addendum)
Medication Instructions:  Your physician recommends that you continue on your current medications as directed. Please refer to the Current Medication list given to you today.  *If you need a refill on your cardiac medications before your next appointment, please call your pharmacy*   Testing/Procedures: Dr. Percival Spanish has ordered a CT coronary calcium score.   Test location:  [facility] ([address], [place] [address])   This is $99 out of pocket.   Coronary CalciumScan A coronary calcium scan is an imaging test used to look for deposits of calcium and other fatty materials (plaques) in the inner lining of the blood vessels of the heart (coronary arteries). These deposits of calcium and plaques can partly clog and narrow the coronary arteries without producing any symptoms or warning signs. This puts a person at risk for a heart attack. This test can detect these deposits before symptoms develop. Tell a health care provider about: Any allergies you have. All medicines you are taking, including vitamins, herbs, eye drops, creams, and over-the-counter medicines. Any problems you or family members have had with anesthetic medicines. Any blood disorders you have. Any surgeries you have had. Any medical conditions you have. Whether you are pregnant or may be pregnant. What are the risks? Generally, this is a safe procedure. However, problems may occur, including: Harm to a pregnant woman and her unborn baby. This test involves the use of radiation. Radiation exposure can be dangerous to a pregnant woman and her unborn baby. If you are pregnant, you generally should not have this procedure done. Slight increase in the risk of cancer. This is because of the radiation involved in the test. What happens before the procedure? No preparation is needed for this procedure. What happens during the procedure? You will undress and remove any jewelry around your neck or chest. You  will put on a hospital gown. Sticky electrodes will be placed on your chest. The electrodes will be connected to an electrocardiogram (ECG) machine to record a tracing of the electrical activity of your heart. A CT scanner will take pictures of your heart. During this time, you will be asked to lie still and hold your breath for 2-3 seconds while a picture of your heart is being taken. The procedure may vary among health care providers and hospitals. What happens after the procedure? You can get dressed. You can return to your normal activities. It is up to you to get the results of your test. Ask your health care provider, or the department that is doing the test, when your results will be ready. Summary A coronary calcium scan is an imaging test used to look for deposits of calcium and other fatty materials (plaques) in the inner lining of the blood vessels of the heart (coronary arteries). Generally, this is a safe procedure. Tell your health care provider if you are pregnant or may be pregnant. No preparation is needed for this procedure. A CT scanner will take pictures of your heart. You can return to your normal activities after the scan is done. This information is not intended to replace advice given to you by your health care provider. Make sure you discuss any questions you have with your health care provider. Document Released: 08/20/2007 Document Revised: 01/11/2016 Document Reviewed: 01/11/2016 Elsevier Interactive Patient Education  2017 [place]: At [facility], you and your health needs are our priority.  As part of our continuing mission to provide you with exceptional  heart care, we have created designated Provider Care Teams.  These Care Teams include your primary Cardiologist (physician) and Advanced Practice Providers (APPs -  Physician Assistants and Nurse Practitioners) who all work together to provide you with the care you need, when you need  it.  We recommend signing up for the patient portal called "MyChart".  Sign up information is provided on this After Visit Summary.  MyChart is used to connect with patients for Virtual Visits (Telemedicine).  Patients are able to view lab/test results, encounter notes, upcoming appointments, etc.  Non-urgent messages can be sent to your provider as well.   To learn more about what you can do with MyChart, go to [email or web address].    Your next appointment:   We will see you on an as needed basis.  Provider:   Minus Breeding, MD

## 2021-03-24 ENCOUNTER — Other Ambulatory Visit: Payer: Self-pay

## 2021-03-24 ENCOUNTER — Ambulatory Visit: Payer: Medicare Other | Admitting: Orthopedic Surgery

## 2021-03-24 ENCOUNTER — Encounter: Payer: Self-pay | Admitting: Orthopedic Surgery

## 2021-03-24 DIAGNOSIS — M7502 Adhesive capsulitis of left shoulder: Secondary | ICD-10-CM

## 2021-03-24 NOTE — Progress Notes (Signed)
Office Visit Note   Patient: Brenda Smith           Date of Birth: 08/29/1950           MRN: [id number] Visit Date: 03/24/2021 Requested by: No referring provider defined for this encounter. PCP: Lujean Amel, MD  Subjective: Chief Complaint  Patient presents with   Left Shoulder - Follow-up    HPI: Patient presents for follow-up of left frozen shoulder.  She had glenohumeral injection 02/08/2021.  Doing okay.  Is making some progress with range of motion.  Does not wake from sleep at night with shoulder pain.  She does have a pulley at home.  Cannot take anti-inflammatories per her GI doctor.  She does do yoga q. night.              ROS: All systems reviewed are negative as they relate to the chief complaint within the history of present illness.  Patient denies  fevers or chills.   Assessment & Plan: Visit Diagnoses: No diagnosis found.  Plan: Impression is left shoulder adhesive capsulitis with improvement in range of motion.  Plan is continue with shoulder stretching and range of motion exercises.  If she feels like she is having more pain or the shoulder is getting stiffer I would come back and we will do repeat glenohumeral joint injection and formal physical therapy.  For now she should continue doing what she has been doing in order to facilitate improved motion.  Follow-up as needed  Follow-Up Instructions: No follow-ups on file.   Orders:  No orders of the defined types were placed in this encounter.  No orders of the defined types were placed in this encounter.     Procedures: No procedures performed   Clinical Data: No additional findings.  Objective: Vital Signs: There were no vitals taken for this visit.  Physical Exam:   Constitutional: Patient appears well-developed HEENT:  Head: Normocephalic Eyes:EOM are normal Neck: Normal range of motion Cardiovascular: Normal rate Pulmonary/chest: Effort normal Neurologic: Patient is alert Skin: Skin  is warm Psychiatric: Patient has normal mood and affect   Ortho Exam: Ortho exam demonstrates full active and passive range of motion of the cervical spine.  Passive motion on the left is 60/80/150.  On the right 70/95/180.  Rotator cuff strength is good.  No coarse grinding or crepitus with active or passive range of motion of that left shoulder.  Specialty Comments:  No specialty comments available.  Imaging: No results found.   PMFS History: Patient Active Problem List   Diagnosis Date Noted   Fibromyalgia 09/14/2016   Mixed conductive and 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 12/31/2015   Chronic serous otitis media of right ear 12/31/2015   Myalgia 07/18/2012   Pain in limb 12/20/2011   Migraine without aura 12/20/2011   Past Medical History:  Diagnosis Date   Anxiety disorder    Fibromyalgia 09/14/2016   Glaucoma    IBS (irritable bowel syndrome)    Low iron    Migraine headache    Myalgia and myositis, unspecified     Family History  Problem Relation Age of Onset   Heart attack Mother 24       She had mutliple medical problems.  Might of died of an MI   Cancer Mother    Heart attack Father 45   Gout Father    Anxiety disorder Father    CAD Brother 51  Stents   Headache Maternal Grandmother    Heart disease Maternal Grandmother     Past Surgical History:  Procedure Laterality Date   MOHS SURGERY     Squamous cell   TONSILLECTOMY     Social History   Occupational History   Not on file  Tobacco Use   Smoking status: Never   Smokeless tobacco: Never  Substance and Sexual Activity   Alcohol use: Yes    Comment: Drinks wine on occasion   Drug use: No   Sexual activity: Yes    Partners: Male    Birth control/protection: None

## 2021-04-07 ENCOUNTER — Other Ambulatory Visit: Payer: Self-pay

## 2021-04-07 ENCOUNTER — Ambulatory Visit (INDEPENDENT_AMBULATORY_CARE_PROVIDER_SITE_OTHER)
Admission: RE | Admit: 2021-04-07 | Discharge: 2021-04-07 | Disposition: A | Discharge status: Home / Self Care | Payer: Self-pay | Source: Ambulatory Visit | Attending: Cardiology | Admitting: Cardiology

## 2021-04-07 DIAGNOSIS — R001 Bradycardia, unspecified: Secondary | ICD-10-CM

## 2021-04-07 DIAGNOSIS — E785 Hyperlipidemia, unspecified: Secondary | ICD-10-CM

## 2021-06-22 ENCOUNTER — Telehealth: Payer: Self-pay | Admitting: Internal Medicine

## 2021-06-22 NOTE — Telephone Encounter (Signed)
-----   Message from Rita Ohara, MD sent at 06/22/2021  8:57 AM EDT ----- ?Regarding: RE: new pt ?Someone else sent me this request and I replied back to them it was fine. Can?t recall if it was [name], or who sent it to me, but they must not have called him back. I will accept, and she should schedule establish care visit and get records sent prior, if possible. ?----- Message ----- ?From: Koren Bound, CMA ?Sent: 06/22/2021   8:55 AM EDT ?To: Rita Ohara, MD ?Subject: new pt                                        ? ?Brenda Smith would like to know if you will take his wife [name] as a new patient of yours. He said he called a couple weeks ago to find out if you would but never got a call back. Please advise. She has the same insurance as joel he said. ? ? ?

## 2021-06-22 NOTE — Telephone Encounter (Signed)
Brenda Smith was notified that we need to get records and then she can schedule an appointment ?

## 2021-08-10 ENCOUNTER — Telehealth: Payer: Self-pay | Admitting: Family Medicine

## 2021-08-10 NOTE — Telephone Encounter (Signed)
Received new pt records from [place] at [place]. Sending back for review. Pt scheduled an appointment for 08/19/2021.

## 2021-08-18 ENCOUNTER — Encounter: Payer: Self-pay | Admitting: Family Medicine

## 2021-08-18 DIAGNOSIS — E7800 Pure hypercholesterolemia, unspecified: Secondary | ICD-10-CM | POA: Insufficient documentation

## 2021-08-18 NOTE — Progress Notes (Signed)
Chief Complaint  Patient presents with   Establish Care    Wants to talk about lorazepam/ also wants to talk about a  recent ct scan    Patient presents to establish care.  She has h/o migraines, fibromyalgia, IBS, and glaucoma. Records from prior PCP [facility]) reviewed prior to visit (last few notes). Her provider at [facility] had left, and she has seen a few different ones since (keep changing), switching PCP to here, where her husband has been a long-term patient,  She had seen NP in 06/2021, was referred to Dr. Felecia Shelling for headaches and FM. Appt was made for 6/19, and reportedly was cancelled immediately upon scheduling it--he doesn't treat FM, and needed to wait for "headaches to be more severe" (per pt) before she could be seen.  She has a h/o migraines, which she reports resolved 8 years ago. She is unsure if recent headaches are related to coming down on alprazolam, or from meds from Dr. Collene Mares.  She would like to talk about the lorazepam today.  She was on lorazepam 0.'5mg'$  for 12 years. This is for anxiety. Initially took it TID, then BID. Started tapering down from a dose of 0.'5mg'$  BID in January.  She is currently taking  either 1/2 tablet BID, or a full tablet at bedtime.  She has been having trouble sleeping.  She has hydroxyzine at home, but hasn't been using it.  It made her groggy in the morning, worked a little better when she took it earlier in the evening. Has been taking the full tablet of lorazepam at bedtime instead of the hydroxyzine.  She sleeps 4-6 hours/night per her FitBit. Only sometimes has trouble falling asleep (taking lorazepam). She wakes up 1 am, and again between 3-4 am, and can't back to sleep. Goes to bed at 10. No chocolate in the evenings, rare alcohol. She wakes up with L sided headache. She know that she grinds her teeth (cracked a tooth, per dentist), heart sometimes is racing.  She calms herself down and sometimes can get back to sleep for an hour.   She does  have ongoing anxiety that she would like to treat.  As reported above, it is affecting her sleep.  She also reports that she "needs to be more social", which takes her out of her comfort zone.  Friends are dying, needs to find more friends. Afraid the things she "needs to do" will bring anxiety. 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 She would like something to help her when needed. Has phobias related to the dentist.  Can feel trapped in social situations.  She cannot recall which other anxiety medications she has taken (and/or for fibromyalgia, such as duloxetine), and which side effects she may have had--she reports she has files at home that she would like to consult.  Bradycardia--she saw Dr. Percival Spanish in January 2023, referred by PCP for bradycardia.  This is asymptomatic, and no intervention or further workup was suggested. Coronary calcium scan was done in 04/2021, ordered by Dr. Percival Spanish due to family hx CAD (and also borderline lipids).  She had calcium score of 0.  Noncardiac portion: IMPRESSION: 11 mm ground-glass nodular area in the left lower lobe. Initial follow-up with CT at 6-12 months is recommended to confirm persistence. If persistent, repeat CT is recommended every 2 years until 5 years of stability has been established.   PMH: Info obtained from available records: last AWV 02/2021 colonoscopy 2022, repeat due 2027 Dr. Collene Mares. (  also had 2007, 2017) mammo 12/18/20 02/2021 labs LDL 148, HDL 69, A1c 5.9, glu 98, nl chem 04/2020 vit D 35, nl ferritin, B12, and TSH   Past Medical History:  Diagnosis Date   Adhesive capsulitis of left shoulder    s/p injection 02/2021; Dr. Marlou Sa   Anxiety disorder    Bradycardia    Colon polyps    Dr. Collene Mares   Fibromyalgia 09/14/2016   dx'd Dr. Jannifer Franklin 2017   Glaucoma    IBS (irritable bowel syndrome)    Dr. Collene Mares   Low iron    Migraine headache    prev saw Dr. Catalina Gravel   Myalgia and  myositis, unspecified    SCC (squamous cell carcinoma), face    Past Surgical History:  Procedure Laterality Date   MOHS SURGERY     Squamous cell   TENNIS ELBOW RELEASE/NIRSCHEL PROCEDURE Right [place]   Social History   Social History Narrative   Patient is married, no children.   Patient is right handed.   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   Patient drinks decaffeinated tea. Loves chocolate. Sporadic caffeine.      From [place]   Social History   Tobacco Use   Smoking status: Never   Smokeless tobacco: Never  Substance Use Topics   Alcohol use: Yes    Comment: Drinks wine on occasion, 1-2/month   Drug use: No   Family History  Problem Relation Age of Onset   Anxiety disorder Mother    Heart attack Mother 38       She had mutliple medical problems.  Might of died of an MI   Cancer Mother        breast, lung, bone   Breast cancer Mother        shortly after menopause   Lung cancer Mother        former smoker   Bone cancer Mother    Heart attack Father 36   Gout Father    Anxiety disorder Father    CAD Brother 14       Stents   Stroke Brother 37   Headache Maternal Grandmother        migraines   Heart disease Maternal Grandmother    Diabetes Cousin 25       type 1    Outpatient Encounter Medications as of 08/19/2021  Medication Sig Note   brimonidine (ALPHAGAN P) 0.1 % SOLN Place 1 drop into both eyes 2 (two) times daily.    Cholecalciferol (VITAMIN D3 PO) Take 3,000 Units by mouth.    Coenzyme Q10 (CO Q 10 PO) Take by mouth.    famotidine (PEPCID) 40 MG tablet famotidine 40 mg tablet  TAKE 1 TABLET BY MOUTH EVERYDAY AT BEDTIME    loratadine (CLARITIN) 10 MG tablet Take 10 mg by mouth. 09/14/2016: Childrens liquid   LORazepam (ATIVAN) 0.5 MG tablet Take 0.5 mg by mouth daily. 08/19/2021: 1/2 tablet twice daily or full  tablet at bedtime   Magnesium 400 MG CAPS Take by mouth.    Multiple Vitamins-Minerals (HAIR SKIN AND NAILS FORMULA PO) Take by mouth.    nystatin ointment (MYCOSTATIN) nystatin 100,000 unit/gram topical ointment  APPLY TO AFFECTED AREA AS NEEDED IRRITATION    pyridoxine (B-6) 100 MG tablet Take 100 mg by mouth daily.    sucralfate (CARAFATE) 1 g  tablet Take 1 g by mouth 4 (four) times daily -  with meals and at bedtime. 08/19/2021: Uses prn   Vitamin A 4.5 MG (15000 UT) TABS Take by mouth.    vitamin B-12 (CYANOCOBALAMIN) 500 MCG tablet Take 500 mcg by mouth daily.    vitamin C (ASCORBIC ACID) 500 MG tablet Take 500 mg by mouth daily.    VITAMIN E PO Take 100 mg by mouth.    hydrOXYzine (VISTARIL) 25 MG capsule hydroxyzine pamoate 25 mg capsule  1 CAPSULE AS NEEDED ORALLY AT BEDTIME 90 DAYS (Patient not taking: Reported on 08/19/2021)    omeprazole (PRILOSEC) 40 MG capsule  (Patient not taking: Reported on 08/19/2021)    PROAIR HFA 108 (90 Base) MCG/ACT inhaler INHALE 1 PUFF EVERY 4 HOURS AS NEEDED FOR WHEEZING/SHORTNESS OF BREATH (Patient not taking: Reported on 08/19/2021) 08/19/2021: PRN    tiZANidine (ZANAFLEX) 2 MG tablet Take 1 tablet (2 mg total) by mouth every 6 (six) hours as needed for muscle spasms. (Patient not taking: Reported on 08/19/2021) 08/19/2021: Uses prn, none in the last month   [DISCONTINUED] HYDROcodone-acetaminophen (NORCO) 10-325 MG per tablet Take 1 tablet by mouth every 6 (six) hours as needed for pain. (Patient not taking: Reported on 08/19/2021) 05/17/2017: Only take as needed.    No facility-administered encounter medications on file as of 08/19/2021.   Allergies  Allergen Reactions   Penicillins Anaphylaxis and Hives    Mouth becomes swollen, trouble swallowing   Savella [Milnacipran Hcl] Other (See Comments)    "anxiety, constipation"   Pamelor [Nortriptyline Hcl]    Topamax [Topiramate]    ROS: no fever, chills, URI or allergy symptoms.  No headaches, dizziness,  chest pain, shortness of breath or significant fatigue. +anxiety, with interrupted sleep.  She notes some hair loss. She has fibromyalgia with most of her pain in her hips and lower back.  Denies neck, upper back pain. L shoulder pain resolved. Denies depression. Denies bleeding, bruising, rashes. Indigestion/heartburn--on meds per Dr. Collene Mares. +L sided headaches per HPI.   PHYSICAL EXAM:  BP 130/80   Pulse (!) 49   Temp (!) 97.2 F (36.2 C)   Ht 5' 2.25" (1.581 m)   Wt 138 lb 9.6 oz (62.9 kg)   SpO2 98%   BMI 25.15 kg/m   Wt Readings from Last 3 Encounters:  08/19/21 138 lb 9.6 oz (62.9 kg)  03/15/21 138 lb (62.6 kg)  01/07/19 146 lb 12.8 oz (66.6 kg)   Pleasant, well-appearing female, in no distress HEENT: conjunctiva and sclera are clear, EOMI, OP clear, nose without drainage, sinuses nontender. She is mildly tender over the L masseter muscle and L TMJ, nontender over temporalis muscle.   Neck: no lymphadenopathy, thyromegaly or bruit Heart: bradycardic--rate approx 58-60 during my exam (lower when checked by nurse). No ectopy, murmur. Lungs: clear bilaterally Back: no spinal or CVA tenderness. No SI tenderness. No trigger points. Abdomen: soft, nontender, no masses Extremities: no edema, normal pulses Psych: mildly anxious mood, full range of affect.  Normal speech, hygiene and grooming.  Doesn't appear significantly anxious to me, just slightly excitable in nature. Neuro: alert and oriented, cranial nerves intact, normal gait.    ASSESSMENT/PLAN:  Generalized anxiety disorder - Weaning of long-standing use of lorazepam.  Needs preventative med. Has good coping skill/tools. Will bring records of prior meds used and any SE  Left-sided headache - suspect related to grinding, anxiety. Will f/u w/dentist and discuss bite guard  Insomnia, unspecified type - related  to anxiety. Encouraged preventative med--discussed SSRI, SNRI, Buspar. Will gather info of meds previously tried    Abnormal CT scan of lung - ground-glass nodular area in the left lower lobe found on CT 04/2021.  Will repeat in Aug/Sept for 6 month f/u - Plan: CT Chest Wo Contrast  Bradycardia - asymptomatic.  Reviewed s/sx which would be concerning, for which she should f/u with cardiology  FU chest CT at 6-12  mos will be arranged  Schedule AWV, due 02/2022 Pt encouraged to f/u sooner to discuss her anxiety and sleep, and to have looked through her records to see which meds she has taken in the past, and any side effects she may have had. We discussed a lot of med treatment options today, but need to look to see what she may have tried in the past before going forward. She plans to re-try hydroxyzine for sleep, and further taper the lorazepam to 1/2 tablet daily. We briefly discuss Belsomra (as the safest sleep med).  She didn't want the pressure of any kind of deadline (such as a 2-4 week f/u), so she will contact us when ready to further discuss medications.  I spent 70 minutes dedicated to the care of this patient, including pre-visit review of records, face to face time, post-visit ordering of testing and documentation.

## 2021-08-19 ENCOUNTER — Ambulatory Visit: Payer: Medicare Other | Admitting: Family Medicine

## 2021-08-19 ENCOUNTER — Encounter: Payer: Self-pay | Admitting: Family Medicine

## 2021-08-19 VITALS — BP 130/80 | HR 49 | Temp 97.2°F | Ht 62.25 in | Wt 138.6 lb

## 2021-08-19 DIAGNOSIS — D124 Benign neoplasm of descending colon: Secondary | ICD-10-CM

## 2021-08-19 DIAGNOSIS — R918 Other nonspecific abnormal finding of lung field: Secondary | ICD-10-CM

## 2021-08-19 DIAGNOSIS — F411 Generalized anxiety disorder: Secondary | ICD-10-CM | POA: Diagnosis not present

## 2021-08-19 DIAGNOSIS — G4700 Insomnia, unspecified: Secondary | ICD-10-CM

## 2021-08-19 DIAGNOSIS — R519 Headache, unspecified: Secondary | ICD-10-CM | POA: Diagnosis not present

## 2021-08-19 DIAGNOSIS — R001 Bradycardia, unspecified: Secondary | ICD-10-CM

## 2021-08-19 DIAGNOSIS — K635 Polyp of colon: Secondary | ICD-10-CM | POA: Insufficient documentation

## 2021-08-19 DIAGNOSIS — K219 Gastro-esophageal reflux disease without esophagitis: Secondary | ICD-10-CM | POA: Insufficient documentation

## 2021-08-19 NOTE — Patient Instructions (Addendum)
I'd like to know which medications you have taken in the past for anxiety and for fibromyalgia. If you can recall why these were stopped, that would also be helpful.  I don't want to re-invent the wheel.  (Things like cymbalta, effexor, paxil/zoloft/lexapro/celexa/prozac for anxiety, lyrica and gabapentin could have been for fibromyalgia).  Buspar (buspirone) is a medication for anxiety that might work well for you (won't help with any fibromyalgia pain like cymbalta can). Based on your issues with sleep, I think zoloft or paxil would be the best (if avoiding cymbalta), or effexor or pristiq (SNRI)  Belsomra is a sleeping medication that is the safest for people over 65.  It takes the "wakefulness" away rather than just sedating you. We can try this if the hydroxyzine doesn't seem to be helping or if you feel to groggy from it.   Consider getting another bite guard to protect your teeth.  We will arrange for a follow-up CT scan to re-evaluate the pulmonary abnormality in 6 months.  You can try cutting back on the lorazepam from 1 daily (whether it is 1/2 pill twice a day or 1 full one at night) to every other day only have 1/2 tablet total (at bedtime).  If after 1-2 weeks of using it alternating 1/2 and 1 every other day, you can then cut back to just 1/2 tablet at bedtime, every night. If we start other meds for anxiety and your sleep is improving, we should be able to further cut back at that time.

## 2021-08-20 DIAGNOSIS — F411 Generalized anxiety disorder: Secondary | ICD-10-CM | POA: Insufficient documentation

## 2021-08-23 ENCOUNTER — Ambulatory Visit: Payer: Medicare Other | Admitting: Neurology

## 2021-08-23 ENCOUNTER — Encounter: Payer: Self-pay | Admitting: *Deleted

## 2021-09-12 NOTE — Progress Notes (Unsigned)
  No chief complaint on file.   Patient presents to follow-up on anxiety and insomnia.  We had a long discussion at her establish care visit last month.  She is trying to get off lorazepam, but needs additional treatment for anxiety. She had wanted to review her prior records to see what she has taken in the past (and what type of SE she had, why they were stopped).  We briefly discussed a lot of different medications--SNRI's, SSRI's, Buspar, Belsomra.    She has been on lorazepam x 12 years, and has been tapering the dose since January.  She is currently taking  She would like to taper off the lorazepam, and start other meds for anxiety, as she feels her anxiety holds her back.  She has stated she  "needs to be more social", which takes her out of her comfort zone.  Friends are dying, needs to find more friends.  She is afraid the things she "needs to do" will bring anxiety. She can feel "trapped" in social situations. 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   Insomnia:  she had been taking lorazepam in the evenings, so hadn't been having much trouble falling asleep at her last visit. She was having trouble staying asleep--getting up at 1am and 3-4am with trouble getting back to sleep. (Wakes up with L sided HA, knows she grinds her teeth. She had cracked a tooth; recommended getting new bite guard) At visit last month we discussed  re-trying hydroxyzine for sleep, and further taper the lorazepam to 1/2 tablet daily. We briefly discuss Belsomra (as the safest sleep med).   PMH, PSH, SH reviewed   ROS: no fever, chills, URI symptoms, chest pain, shortness of breath.  L sided morning headaches? Anxiety and insomnia per HPI   PHYSICAL EXAM:  There were no vitals taken for this visit.  Wt Readings from Last 3 Encounters:  08/19/21 138 lb 9.6 oz (62.9 kg)  03/15/21 138 lb (62.6 kg)  01/07/19 146 lb  12.8 oz (66.6 kg)     ASSESSMENT/PLAN:  No depression screen was done at her establish care visit.  Please do PHQ2/9  I only see [id number] in 2016.  Any other pneumonia vaccine that she has gotten?  See if she has records. If only had the 1, due for pneumovax. Shingrix from pharmacy

## 2021-09-13 ENCOUNTER — Encounter: Payer: Self-pay | Admitting: Family Medicine

## 2021-09-13 ENCOUNTER — Ambulatory Visit: Payer: Medicare Other | Admitting: Family Medicine

## 2021-09-13 VITALS — BP 130/80 | HR 56 | Ht 62.25 in | Wt 140.8 lb

## 2021-09-13 DIAGNOSIS — M797 Fibromyalgia: Secondary | ICD-10-CM | POA: Diagnosis not present

## 2021-09-13 DIAGNOSIS — G4700 Insomnia, unspecified: Secondary | ICD-10-CM

## 2021-09-13 DIAGNOSIS — F411 Generalized anxiety disorder: Secondary | ICD-10-CM

## 2021-09-13 MED ORDER — SERTRALINE HCL 50 MG PO TABS: ORAL_TABLET | ORAL | 1 refills | Status: DC

## 2021-09-13 NOTE — Patient Instructions (Signed)
  Start sertraline (zoloft) at 1/2 tablet at bedtime.  If this causes significant side effects (headache, nausea, worsening anxiety) you can cut it in fourths, if you're able to. After 1 week, if the side effects have improved (or longer, if it takes longer for them to resolve),then go up to the next dose (1/2 tablet if you start at 1/4, or up to the full tablet a week or so after tolerating the 1/2 tablet). Ultimately likely '50mg'$  will be the effective dose (full tablet), but if you can't tolerate the full, we can stay at the 1/2 tablet for 4-6 weeks and see if this gives you adequate benefit or not.  Take the lorazepam 1/2 tablet EVERY OTHER night. You can use the hydroxyzine if you're struggling with sleep on the nights you aren't taking lorazepam. Ultimately, once the sertraline gets in your system, your sleep might improve, related to your anxiety improving.    After 2 weeks of lorazepam every other night, switch to every 3rd night for a week, then you can try without any (vs taking it once a week for another week or so). You can use 1/2 tablet if needed for severe anxiety (prn medication, not scheduled/planned).  You can use it prior to the dentist.

## 2021-10-04 ENCOUNTER — Encounter: Payer: Self-pay | Admitting: Family Medicine

## 2021-10-06 ENCOUNTER — Other Ambulatory Visit: Payer: Self-pay | Admitting: Family Medicine

## 2021-10-06 DIAGNOSIS — F411 Generalized anxiety disorder: Secondary | ICD-10-CM

## 2021-10-25 ENCOUNTER — Other Ambulatory Visit: Payer: Medicare Other

## 2021-10-26 NOTE — Patient Instructions (Incomplete)
I encourage you to get the high dose flu shot (we don't yet have in the office--you can get from the pharmacy or check back here in a couple of weeks). I encourage you to get the updated bivalent COVID booster when it is available in the Fall (listen to the news, possibly the end of September). I also recommend the new RSV vaccine when it becomes available.  You likely will need to get this from the pharmacy.  These vaccines can either be given on the same day, or separated by at least 3 weeks.  Continue the sertraline--increase back to a full tablet each evening. After 1-2 weeks, further increase to 1.5 tablets ('75mg'$  dose).  If tolerating that dose, you can further increase to 2 tablets ('100mg'$  dose) after another 1-2 weeks. Stay at '100mg'$  (if tolerated) for 4-6 weeks and then follow up. If you cannot tolerate the '100mg'$  dose, go back to the highest tolerated dose (ie '75mg'$ ).  If ultimately you can't tolerate the doses needed to see an effect on your anxiety, we may need to consider starting Buspar, which I don't believe you have tried before.  This is a medication specifically for anxiety, taken twice a day.  Continue the 1/2 tablet of lorazepam at bedtime for the next 2 weeks. Then consider skipping every 3rd night and see how you do.  If no increase in nerve pain, but not sleeping well, then try taking 1/2 tablet of benadryl in the evening to see if that helps with sleep.  If you continue to have "nerve zinging" then we should re-try taking '100mg'$  of gabapentin just at night (not three times daily like you tried in the past).  This is excellent for nerve pain and for fibromyalgia, if you can tolerate it.  I'm not starting this right away, as you stated that taking the lorazepam seems to really help minimize this type of pain. As we try and cut back on the lorazepam, if this nerve pain increases, we should try the gabapentin.  For your muscle pains, we are going to check some labs.  Try doing  the IT band stretches as we discussed (touching your toes (or trying to!) with your feet crossed, doing it on both sides; also letting the straight leg hang off the side of the bed to stretch the outer part of the thigh).

## 2021-10-26 NOTE — Progress Notes (Unsigned)
  No chief complaint on file.   Patient presents for 6 week f/u on anxiety and insomnia. We started sertraline at her last visit, in order to help treat anxiety, and be able to further taper down on her lorazepam (which she has been taking daily for over a decade).  On 7/31 she reported side effects of tingling of nerve endings waist down, muscles hard, then spasms on 7/30. Anxiety increased. Sleep 4-6 hrs.  Ears ringing. She was taking Lorazepam half tablet every other day, had been on the full sertraline tablet ('50mg'$ ) for 10 days. Suggested that she continue the '50mg'$  dose (she was asking if she should cut back to 1/2 tablet), but advised she could use an extra 1/2 tablet of lorazepam as needed for worsening anxiety, vs going back to taking the 1/2 tablet daily for longer.  On 8/9 she reported unending nerve ending tingling groin and anal areas, going down my legs.  Insomnia:    Taking lorazepam at night? Or hydroxyzine at 6pm?  PMH, PSH, SH reviewed   PHYSICAL EXAM:  There were no vitals taken for this visit.  Wt Readings from Last 3 Encounters:  09/13/21 140 lb 12.8 oz (63.9 kg)  08/19/21 138 lb 9.6 oz (62.9 kg)  03/15/21 138 lb (62.6 kg)      ASSESSMENT/PLAN:  GAD-7 Offer pneumovax  Consider gabapentin '100mg'$  qhs (prev reported leg swelling when taking '100mg'$  TID)  Cont zoloft at 50, vs increase to 75??

## 2021-10-27 ENCOUNTER — Encounter: Payer: Self-pay | Admitting: Family Medicine

## 2021-10-27 ENCOUNTER — Ambulatory Visit: Payer: Medicare Other | Admitting: Family Medicine

## 2021-10-27 VITALS — BP 130/80 | HR 48 | Wt 135.0 lb

## 2021-10-27 DIAGNOSIS — G629 Polyneuropathy, unspecified: Secondary | ICD-10-CM

## 2021-10-27 DIAGNOSIS — R7303 Prediabetes: Secondary | ICD-10-CM

## 2021-10-27 DIAGNOSIS — M62838 Other muscle spasm: Secondary | ICD-10-CM

## 2021-10-27 DIAGNOSIS — F411 Generalized anxiety disorder: Secondary | ICD-10-CM

## 2021-10-27 DIAGNOSIS — Z5181 Encounter for therapeutic drug level monitoring: Secondary | ICD-10-CM

## 2021-10-27 DIAGNOSIS — M791 Myalgia, unspecified site: Secondary | ICD-10-CM | POA: Diagnosis not present

## 2021-10-27 DIAGNOSIS — E559 Vitamin D deficiency, unspecified: Secondary | ICD-10-CM

## 2021-10-28 LAB — MAGNESIUM: Magnesium: 2.4 mg/dL — ABNORMAL HIGH (ref 1.6–2.3)

## 2021-10-28 LAB — CBC WITH DIFFERENTIAL/PLATELET
Basophils Absolute: 0.1 10*3/uL (ref 0.0–0.2)
Basos: 1 %
EOS (ABSOLUTE): 0.1 10*3/uL (ref 0.0–0.4)
Eos: 2 %
Hematocrit: 45.4 % (ref 34.0–46.6)
Hemoglobin: 14.9 g/dL (ref 11.1–15.9)
Immature Grans (Abs): 0 10*3/uL (ref 0.0–0.1)
Immature Granulocytes: 0 %
Lymphocytes Absolute: 2.3 10*3/uL (ref 0.7–3.1)
Lymphs: 38 %
MCH: 29.6 pg (ref 26.6–33.0)
MCHC: 32.8 g/dL (ref 31.5–35.7)
MCV: 90 fL (ref 79–97)
Monocytes Absolute: 0.4 10*3/uL (ref 0.1–0.9)
Monocytes: 7 %
Neutrophils Absolute: 3.2 10*3/uL (ref 1.4–7.0)
Neutrophils: 52 %
Platelets: 308 10*3/uL (ref 150–450)
RBC: 5.04 x10E6/uL (ref 3.77–5.28)
RDW: 12.9 % (ref 11.7–15.4)
WBC: 6.1 10*3/uL (ref 3.4–10.8)

## 2021-10-28 LAB — COMPREHENSIVE METABOLIC PANEL
ALT: 17 IU/L (ref 0–32)
AST: 21 IU/L (ref 0–40)
Albumin/Globulin Ratio: 2.3 — ABNORMAL HIGH (ref 1.2–2.2)
Albumin: 4.8 g/dL (ref 3.8–4.8)
Alkaline Phosphatase: 82 IU/L (ref 44–121)
BUN/Creatinine Ratio: 10 — ABNORMAL LOW (ref 12–28)
BUN: 8 mg/dL (ref 8–27)
Bilirubin Total: 0.4 mg/dL (ref 0.0–1.2)
CO2: 23 mmol/L (ref 20–29)
Calcium: 9.8 mg/dL (ref 8.7–10.3)
Chloride: 97 mmol/L (ref 96–106)
Creatinine, Ser: 0.82 mg/dL (ref 0.57–1.00)
Globulin, Total: 2.1 g/dL (ref 1.5–4.5)
Glucose: 91 mg/dL (ref 70–99)
Potassium: 5 mmol/L (ref 3.5–5.2)
Sodium: 136 mmol/L (ref 134–144)
Total Protein: 6.9 g/dL (ref 6.0–8.5)
eGFR: 76 mL/min/{1.73_m2} (ref >59)

## 2021-10-28 LAB — HEMOGLOBIN A1C
Est. average glucose Bld gHb Est-mCnc: 123 mg/dL
Hgb A1c MFr Bld: 5.9 % — ABNORMAL HIGH (ref 4.8–5.6)

## 2021-10-28 LAB — CK: Total CK: 80 U/L (ref 32–182)

## 2021-10-28 LAB — VITAMIN B12: Vitamin B-12: 899 pg/mL (ref 232–1245)

## 2021-10-28 LAB — VITAMIN D 25 HYDROXY (VIT D DEFICIENCY, FRACTURES): Vit D, 25-Hydroxy: 32.4 ng/mL (ref 30.0–100.0)

## 2021-11-09 ENCOUNTER — Other Ambulatory Visit: Payer: Self-pay | Admitting: Family Medicine

## 2021-11-09 ENCOUNTER — Encounter: Payer: Self-pay | Admitting: Family Medicine

## 2021-11-09 DIAGNOSIS — F411 Generalized anxiety disorder: Secondary | ICD-10-CM

## 2021-11-10 ENCOUNTER — Encounter: Payer: Self-pay | Admitting: Internal Medicine

## 2021-11-13 ENCOUNTER — Encounter: Payer: Self-pay | Admitting: Family Medicine

## 2021-11-13 ENCOUNTER — Other Ambulatory Visit: Payer: Self-pay | Admitting: Family Medicine

## 2021-11-15 ENCOUNTER — Encounter: Payer: Self-pay | Admitting: Family Medicine

## 2021-11-15 NOTE — Telephone Encounter (Signed)
Is this okay to refill? 

## 2021-11-16 MED ORDER — TIZANIDINE HCL 2 MG PO TABS: 2.0000 mg | ORAL_TABLET | Freq: Four times a day (QID) | ORAL | 0 refills | Status: DC | PRN

## 2021-11-17 ENCOUNTER — Ambulatory Visit
Admission: RE | Admit: 2021-11-17 | Discharge: 2021-11-17 | Disposition: A | Discharge status: Home / Self Care | Payer: Medicare Other | Source: Ambulatory Visit | Attending: Family Medicine | Admitting: Family Medicine

## 2021-11-17 DIAGNOSIS — R918 Other nonspecific abnormal finding of lung field: Secondary | ICD-10-CM

## 2021-11-21 ENCOUNTER — Encounter: Payer: Self-pay | Admitting: Family Medicine

## 2021-11-22 MED ORDER — LORAZEPAM 0.5 MG PO TABS: 0.2500 mg | ORAL_TABLET | Freq: Two times a day (BID) | ORAL | 0 refills | Status: DC | PRN

## 2021-11-30 ENCOUNTER — Other Ambulatory Visit: Payer: Self-pay | Admitting: Family Medicine

## 2021-11-30 DIAGNOSIS — F411 Generalized anxiety disorder: Secondary | ICD-10-CM

## 2021-12-14 ENCOUNTER — Encounter: Payer: Self-pay | Admitting: Internal Medicine

## 2021-12-17 LAB — HM PAP SMEAR: HM Pap smear: NORMAL

## 2021-12-19 NOTE — Progress Notes (Unsigned)
No chief complaint on file.    Message from pt 9/11: I have taken one and a half of Zoloft and half tablet of Lorazepam at night. Experiencing terrible nightmares, nausea, headaches and shaking. During the morning I take another half of  Lorazepam to stop shaking, and nausea. I leave at end of month for vacation and want to feel better. Advice please.   My response: If I recall correctly, you have been at this dose since 9/1.  I don't recall you mentioning nightmares on previous messages, not sure if it is from the change in dose or not. I truly think you might be best served by seeing a psychiatrist for your anxiety. If you are feeling worse on the 1.5 tablets, you can cut back to the 1 tablet, and try and get in with a psychiatrist that takes your insurance (check with [facility]).  I know this won't happen before your trip, but hopefully you'll feel better as you decrease the zoloft dose.  She is taking lorazepam 1/2 tablet qHS, and 1/2 during the day prn, last rx'd #45 on 11/22/21.  We discussed at her last visit (end of August), to consider starting Buspar if she can't tolerate higher doses of sertraline. She declined at that time--didn't want new med with her sister coming.  It has since been recommended that she see psych as reported above.   PMH, PSH, SH reviewed   ROS: no fever, chill, URI symptoms, no n/v/d, chest pain, edema   PHYSICAL EXAM:  There were no vitals taken for this visit.  Wt Readings from Last 3 Encounters:  10/27/21 135 lb (61.2 kg)  09/13/21 140 lb 12.8 oz (63.9 kg)  08/19/21 138 lb 9.6 oz (62.9 kg)      ASSESSMENT/PLAN:   PHQ-9, GAD-7  Did she make appt with psych? Need RF sertraline if staying on it--need to know what dose she is actually taking currently (1 or 1.5 tabs)

## 2021-12-20 ENCOUNTER — Encounter: Payer: Self-pay | Admitting: Family Medicine

## 2021-12-20 ENCOUNTER — Ambulatory Visit: Payer: Medicare Other | Admitting: Family Medicine

## 2021-12-20 VITALS — BP 130/70 | HR 76 | Ht 62.25 in | Wt 136.0 lb

## 2021-12-20 DIAGNOSIS — M797 Fibromyalgia: Secondary | ICD-10-CM | POA: Diagnosis not present

## 2021-12-20 DIAGNOSIS — R110 Nausea: Secondary | ICD-10-CM | POA: Diagnosis not present

## 2021-12-20 DIAGNOSIS — Z7185 Encounter for immunization safety counseling: Secondary | ICD-10-CM | POA: Diagnosis not present

## 2021-12-20 DIAGNOSIS — R6881 Early satiety: Secondary | ICD-10-CM

## 2021-12-20 DIAGNOSIS — F411 Generalized anxiety disorder: Secondary | ICD-10-CM

## 2021-12-20 LAB — HM PAP SMEAR: HM Pap smear: NORMAL

## 2021-12-20 MED ORDER — SERTRALINE HCL 50 MG PO TABS: 50.0000 mg | ORAL_TABLET | Freq: Every day | ORAL | 1 refills | Status: DC

## 2021-12-20 NOTE — Patient Instructions (Addendum)
If you continue to feel full quickly, eating only small portions, and notice that you are losing weight, we should send you back to Dr. Collene Mares for further evaluation. Since this seems fairly new, and no weight loss--try taking a pepcid AC ('20mg'$ --this is the over-the-counter dose)before breakfast and/or dinner  (vs taking your prescription '40mg'$  only before dinner) to see if this helps with your nausea. Try and avoid spicy foods, caffeine, acidic foods such as citrus and tomatoes.  It seems that you have been doing well, and not having the nerve ending pain.  Since you have been doing well with occasionally skipping the evening lorazepam, let's try and be intentional about skipping it once a week for a week, and if doing well, then try skipping it 2 days/week (not back to back, do a Mon/Thurs for example), and in another week or two, consider cutting back further.  Taking it very slowly, and using 1/2 tablet if you need it (even if it is a night you're supposed to skip it).  I don't think you need more medicine for your anxiety at this point.  If you continue to feel anxious, I encourage you to see a therapist regularly. If your anxiety doesn't seem controlled/manageable, then I would encourage you to see a psychiatrist to further discuss medications.  I encourage you to get the new RSV vaccine from the pharmacy.  Feel free to ask the pharmacist about any concerns regarding the vaccine.  COVID booster is recommended. Vaccines should be separated by 2 weeks.

## 2021-12-22 ENCOUNTER — Telehealth: Payer: Self-pay | Admitting: Family Medicine

## 2021-12-22 NOTE — Telephone Encounter (Signed)
Pt left message that she signed for records for her internal exam with Dr Tressia Danas, she states they also have her bone density and request we get that from them also.

## 2021-12-24 LAB — HM MAMMOGRAPHY

## 2021-12-29 ENCOUNTER — Encounter: Payer: Self-pay | Admitting: Family Medicine

## 2021-12-29 ENCOUNTER — Telehealth: Payer: Self-pay | Admitting: Family Medicine

## 2021-12-29 NOTE — Telephone Encounter (Signed)
[facility] records received

## 2021-12-30 ENCOUNTER — Encounter: Payer: Self-pay | Admitting: Family Medicine

## 2021-12-30 DIAGNOSIS — M810 Age-related osteoporosis without current pathological fracture: Secondary | ICD-10-CM | POA: Insufficient documentation

## 2022-01-04 ENCOUNTER — Other Ambulatory Visit (INDEPENDENT_AMBULATORY_CARE_PROVIDER_SITE_OTHER): Payer: Medicare Other

## 2022-01-04 ENCOUNTER — Encounter: Payer: Self-pay | Admitting: *Deleted

## 2022-01-04 DIAGNOSIS — Z23 Encounter for immunization: Secondary | ICD-10-CM | POA: Diagnosis not present

## 2022-01-14 ENCOUNTER — Encounter: Payer: Self-pay | Admitting: Family Medicine

## 2022-02-08 ENCOUNTER — Other Ambulatory Visit: Payer: Self-pay | Admitting: Family Medicine

## 2022-02-08 MED ORDER — TIZANIDINE HCL 2 MG PO TABS: 2.0000 mg | ORAL_TABLET | Freq: Four times a day (QID) | ORAL | 0 refills | Status: DC | PRN

## 2022-02-08 MED ORDER — LORAZEPAM 0.5 MG PO TABS: 0.2500 mg | ORAL_TABLET | Freq: Two times a day (BID) | ORAL | 0 refills | Status: DC | PRN

## 2022-02-13 IMAGING — CT CT CARDIAC CORONARY ARTERY CALCIUM SCORE
3 series · 14 of 20 positions shown, 16 images · non-contrast
Comparison: None.
COMPARISON: None.

Addendum:
EXAM:
OVER-READ INTERPRETATION  CT CHEST

The following report is an over-read performed by radiologist Dr.
Radulovic De Flavia [REDACTED] on 04/07/2021. This over-read
does not include interpretation of cardiac or coronary anatomy or
pathology. The coronary calcium score interpretation by the
cardiologist is attached.
CLINICAL DATA: Cardiovascular Disease Risk stratification
Coronary Calcium Score
TECHNIQUE: A gated, non-contrast computed tomography scan of the heart was
performed using 3mm slice thickness. Axial images were analyzed on a
dedicated workstation. Calcium scoring of the coronary arteries was
performed using the Agatston method.

[Series 2: cascseq 2.0 sa36 70% (id) · axial · 0.39mm/px · z∈[-230,-150]mm · 4 of 68 slices shown]
[im 14/68  vessel]
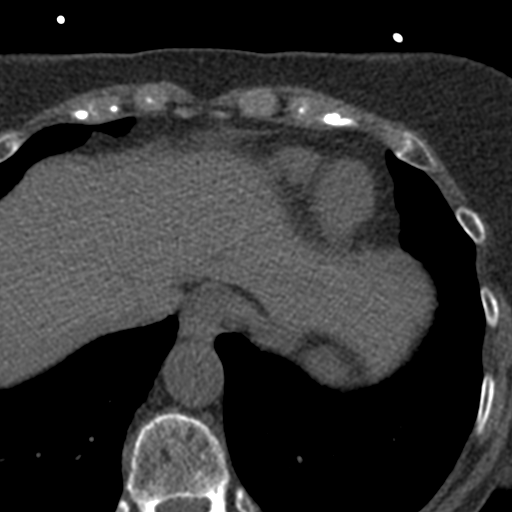
[im 27/68  vessel]
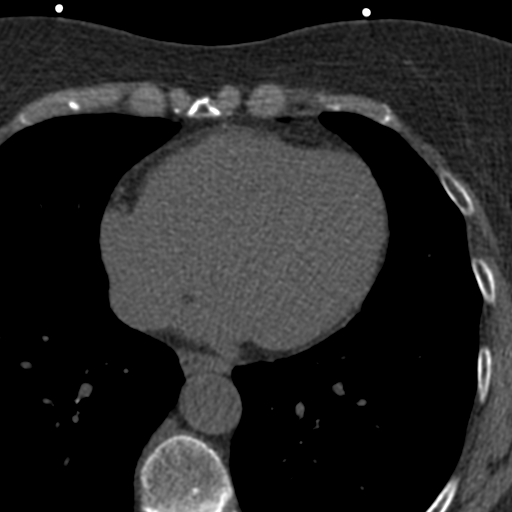
[im 41/68  vessel]
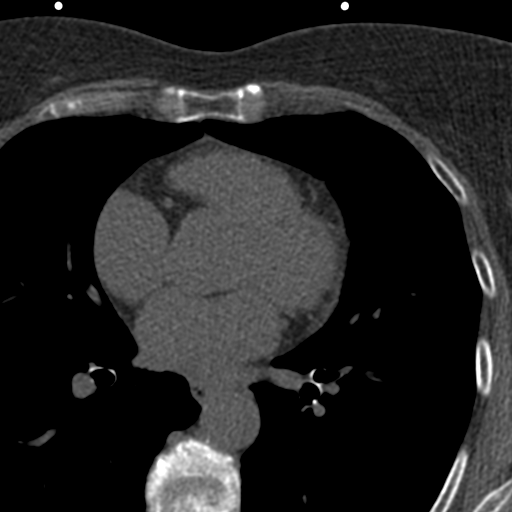
[im 54/68  vessel]
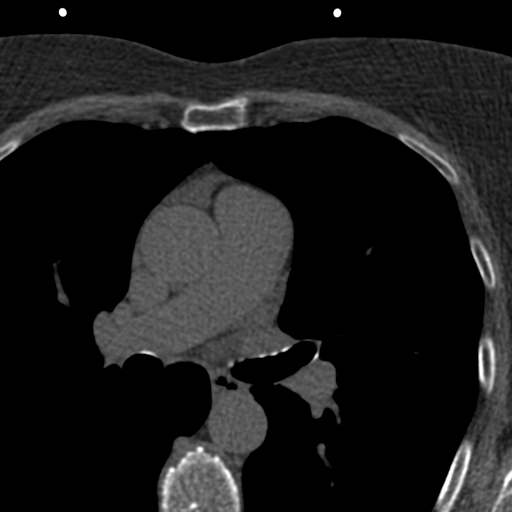

[Series 3: cascseq 2.0 bf37 st · axial · 0.63mm/px · z∈[-234,-146]mm · 5 of 68 slices shown, 7 images]
[im 12/68  vessel]
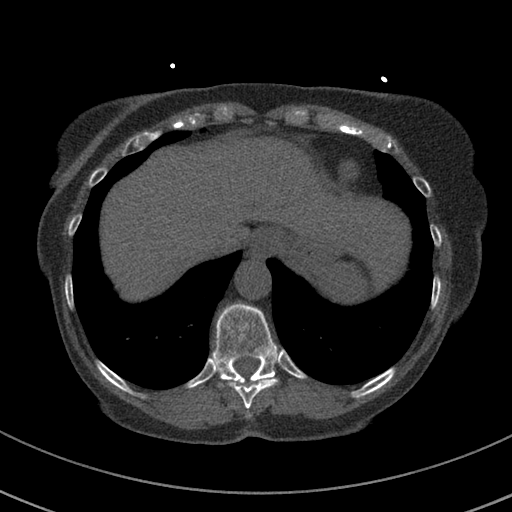
[im 12/68  lung]
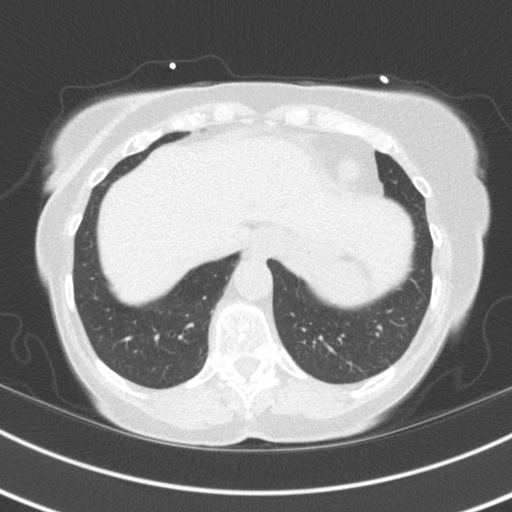
[im 23/68  vessel]
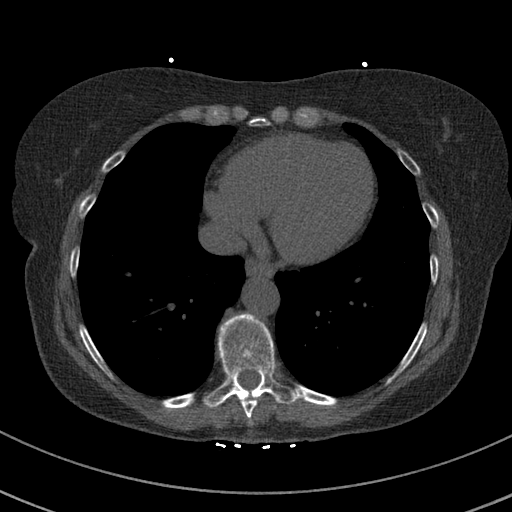
[im 34/68  vessel]
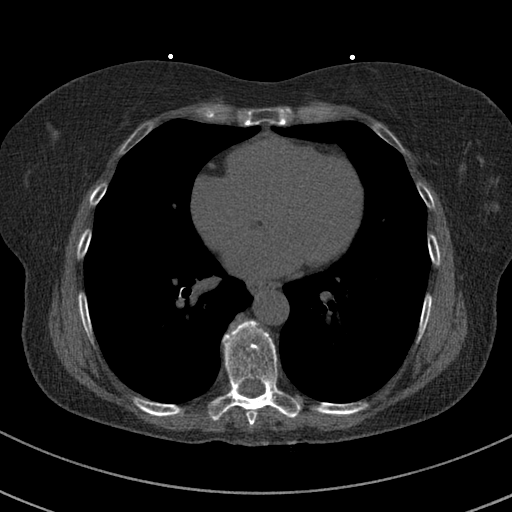
[im 45/68  vessel]
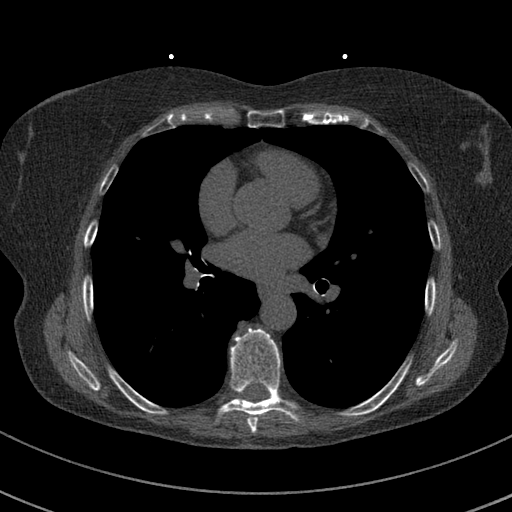
[im 56/68  vessel]
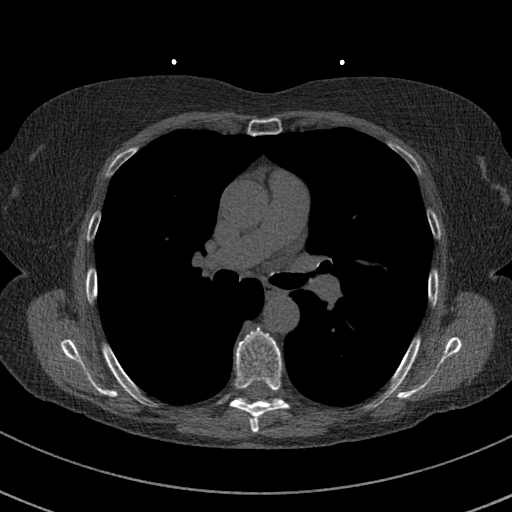
[im 56/68  lung]
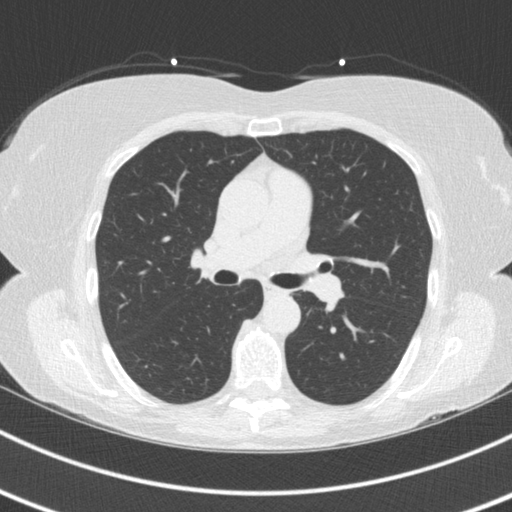

[Series 4: cascseq 2.0 br59 lung · axial · 0.63mm/px · z∈[-234,-146]mm · 5 of 68 slices shown]
[im 12/68  lung]
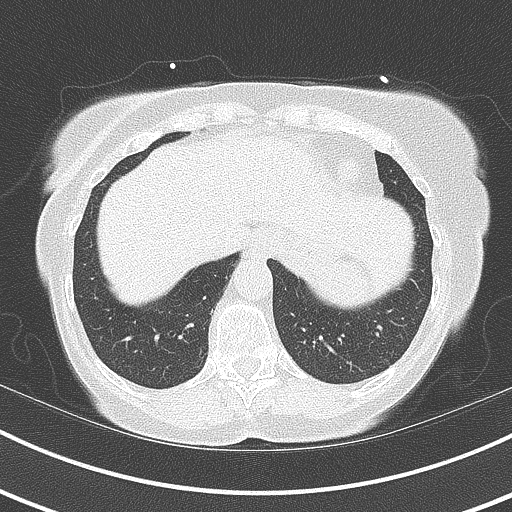
[im 23/68  lung]
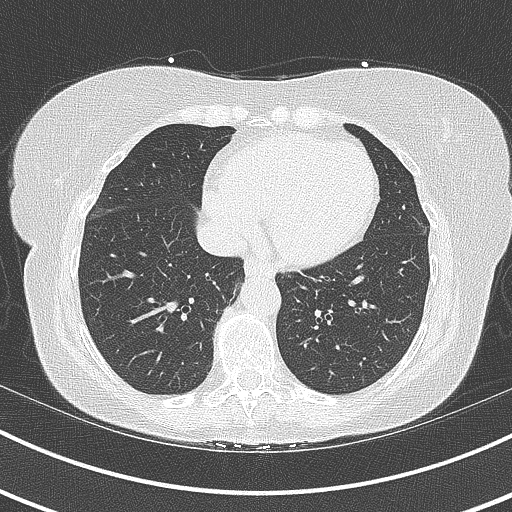
[im 34/68  lung]
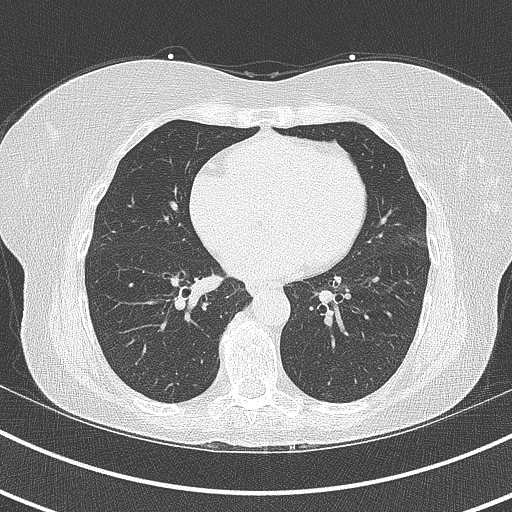
[im 45/68  lung]
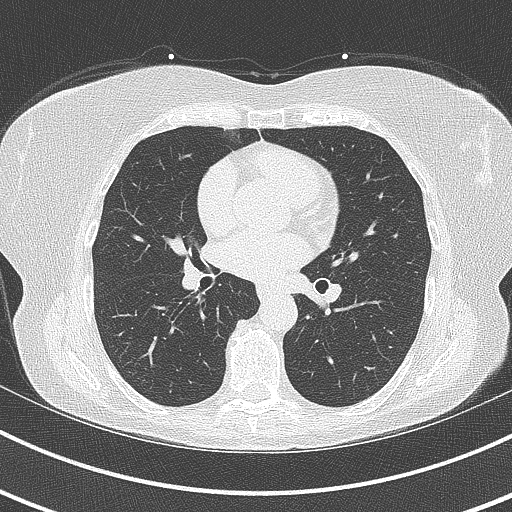
[im 56/68  lung]
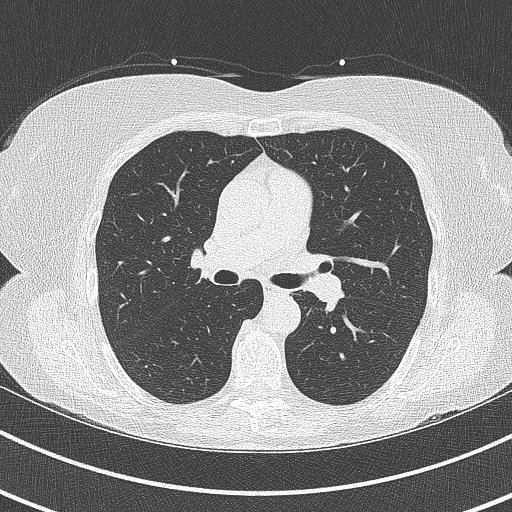

[14 of 20 positions shown; findings below may reference images not displayed]

FINDINGS: Vascular: Heart is normal size.  Aorta normal caliber.

Mediastinum/Nodes: No adenopathy

Lungs/Pleura: Ground-glass somewhat nodular area in the left lower
lobe measures 11 mm on image 30. Otherwise no confluent opacities or
effusions.

Upper Abdomen: No acute findings

Musculoskeletal: Chest wall soft tissues are unremarkable. No acute
bony abnormality.
IMPRESSION: 11 mm ground-glass nodular area in the left lower lobe. Initial
follow-up with CT at 6-12 months is recommended to confirm
persistence. If persistent, repeat CT is recommended every 2 years
until 5 years of stability has been established. This recommendation
follows the consensus statement: Guidelines for Management of
Incidental Pulmonary Nodules Detected on CT Images: From the
FINDINGS: Coronary arteries: Normal origins.

Coronary Calcium Score:

Left main: 0

Left anterior descending artery: 0

Left circumflex artery: 0

Right coronary artery: 0

Total: 0

Pericardium: Normal.

Ascending Aorta: Normal caliber.

Non-cardiac: See separate report from [REDACTED].
IMPRESSION: Coronary calcium score of 0 Agatston units. This suggests low risk
for future cardiac events.



If CAC=0, it is reasonable to withhold statin therapy and reassess
in 5 to 10 years, as long as higher risk conditions are absent
(diabetes mellitus, family history of premature CHD in first degree
relatives (males <55 years; females <65 years), cigarette smoking,
or LDL >=190 mg/dL).

If CAC is 1 to 99, it is reasonable to initiate statin therapy for
patients >=55 years of age.

If CAC is >=100 or >=75th percentile, it is reasonable to initiate
statin therapy at any age.

Cardiology referral should be considered for patients with CAC
scores >=400 or >=75th percentile.

*8298 AHA/ACC/AACVPR/AAPA/ABC/SATYA/NIKMUHAMMAD/QUYEN/[place]ace/NEMANJA NEKA/HENDRARTO/FERIENHAUS
Guideline on the Management of Blood Cholesterol: A Report of the
American College of Cardiology/American Heart Association Task Force
on Clinical Practice Guidelines. J Am Coll Cardiol.
3004;73(24):0761-0077.

*** End of Addendum ***
EXAM:
OVER-READ INTERPRETATION  CT CHEST

The following report is an over-read performed by radiologist Dr.
Radulovic De Flavia [REDACTED] on 04/07/2021. This over-read
does not include interpretation of cardiac or coronary anatomy or
pathology. The coronary calcium score interpretation by the
cardiologist is attached.
FINDINGS: Vascular: Heart is normal size.  Aorta normal caliber.

Mediastinum/Nodes: No adenopathy

Lungs/Pleura: Ground-glass somewhat nodular area in the left lower
lobe measures 11 mm on image 30. Otherwise no confluent opacities or
effusions.

Upper Abdomen: No acute findings

Musculoskeletal: Chest wall soft tissues are unremarkable. No acute
bony abnormality.
IMPRESSION: 11 mm ground-glass nodular area in the left lower lobe. Initial
follow-up with CT at 6-12 months is recommended to confirm
persistence. If persistent, repeat CT is recommended every 2 years
until 5 years of stability has been established. This recommendation
follows the consensus statement: Guidelines for Management of
Incidental Pulmonary Nodules Detected on CT Images: From the

## 2022-03-10 ENCOUNTER — Ambulatory Visit: Payer: Medicare Other | Admitting: Family Medicine

## 2022-03-14 ENCOUNTER — Telehealth: Payer: Medicare Other | Admitting: Family Medicine

## 2022-03-14 ENCOUNTER — Encounter: Payer: Self-pay | Admitting: Family Medicine

## 2022-03-14 VITALS — Temp 99.0°F | Ht 62.25 in | Wt 135.0 lb

## 2022-03-14 DIAGNOSIS — U071 COVID-19: Secondary | ICD-10-CM | POA: Diagnosis not present

## 2022-03-14 MED ORDER — NIRMATRELVIR/RITONAVIR (PAXLOVID)TABLET: 3.0000 | ORAL_TABLET | Freq: Two times a day (BID) | ORAL | 0 refills | Status: AC

## 2022-03-14 NOTE — Progress Notes (Signed)
Start time: 8:39 End time: 9:06  Virtual Visit via Video Note  I connected with Brenda Smith on 03/14/22 by a video enabled telemedicine application and verified that I am speaking with the correct person using two identifiers.  There was an issue with the audio--needed to use the phone (she could see/hear me, I couldn't hear her)  Location: Patient: home Provider: office   I discussed the limitations of evaluation and management by telemedicine and the availability of in person appointments. The patient expressed understanding and agreed to proceed.  History of Present Illness:  Chief Complaint  Patient presents with   Sore Throat    VIRTUAL sore throat, chills, fever and body aches. Headache that started Saturday. Husband has covid. Has not done any home covid tests.    Symptoms started Saturday night, 1/6, with headache, chills, nausea, body aches. She is feeling a little better now--lower fevers and aching, slight headache, congestion. Not coughing.  Tmax 100.1.  She took Tylenol Saturday, no other tylenol or other OTC meds since then. T 99 today.  Husband was diagnosed with COVID last week. Not isolating from her husband--sleeping in the same room, etc.  She has been going to the health club, grocery store (did not go to her brother-in-law's funeral).  PMH, PSH, SH reviewed  Outpatient Encounter Medications as of 03/14/2022  Medication Sig Note   brimonidine (ALPHAGAN P) 0.1 % SOLN Place 1 drop into both eyes 2 (two) times daily.    nirmatrelvir/ritonavir (PAXLOVID) 20 x 150 MG & 10 x '100MG'$  TABS Take 3 tablets by mouth 2 (two) times daily for 5 days. (Take nirmatrelvir 150 mg two tablets twice daily for 5 days and ritonavir 100 mg one tablet twice daily for 5 days) Patient GFR is 76    sertraline (ZOLOFT) 50 MG tablet Take 1 tablet (50 mg total) by mouth at bedtime.    Biotin (BIOTIN 5000) 5 MG CAPS Take 1 capsule by mouth daily. (Patient not taking: Reported on 03/14/2022)  03/14/2022: Has not taken since not feeling well   Cholecalciferol (VITAMIN D3 PO) Take 3,000 Units by mouth. (Patient not taking: Reported on 03/14/2022) 03/14/2022: Has not taken since not feeling well    Coenzyme Q10 (CO Q 10 PO) Take by mouth. (Patient not taking: Reported on 03/14/2022) 03/14/2022: Has not taken since not feeling well    cyclobenzaprine (FLEXERIL) 10 MG tablet  (Patient not taking: Reported on 03/14/2022) 10/27/2021: Last dose was March   Evening Primrose Oil 500 MG CAPS Take 1 capsule by mouth daily. (Patient not taking: Reported on 03/14/2022) 03/14/2022: Has not taken since not feeling well    famotidine (PEPCID) 40 MG tablet  (Patient not taking: Reported on 12/20/2021) 12/20/2021: prn   hydrOXYzine (VISTARIL) 25 MG capsule  (Patient not taking: Reported on 12/20/2021) 12/20/2021: prn   loratadine (CLARITIN) 10 MG tablet Take 10 mg by mouth. (Patient not taking: Reported on 03/14/2022) 09/14/2016: Childrens liquid   LORazepam (ATIVAN) 0.5 MG tablet Take 0.5-1 tablets (0.25-0.5 mg total) by mouth 2 (two) times daily as needed for anxiety. (Patient not taking: Reported on 03/14/2022) 03/14/2022: prn   Omega-3 350 MG CPDR Take 2 capsules by mouth daily. (Patient not taking: Reported on 03/14/2022)    PROAIR HFA 108 (90 Base) MCG/ACT inhaler  (Patient not taking: Reported on 12/20/2021) 10/27/2021: Has not had to use recently   pyridoxine (B-6) 100 MG tablet Take 100 mg by mouth daily. (Patient not taking: Reported on 03/14/2022) 03/14/2022: Has not  taken since not feeling well    sucralfate (CARAFATE) 1 g tablet Take 1 g by mouth 4 (four) times daily -  with meals and at bedtime. (Patient not taking: Reported on 12/20/2021) 03/14/2022: prn   tiZANidine (ZANAFLEX) 2 MG tablet Take 1 tablet (2 mg total) by mouth every 6 (six) hours as needed for muscle spasms. (Patient not taking: Reported on 03/14/2022) 03/14/2022: prn   vitamin B-12 (CYANOCOBALAMIN) 500 MCG tablet Take 500 mcg by mouth daily. (Patient not  taking: Reported on 03/14/2022) 03/14/2022: Has not taken since not feeling well    vitamin C (ASCORBIC ACID) 500 MG tablet Take 500 mg by mouth daily. (Patient not taking: Reported on 03/14/2022) 03/14/2022: Has not taken since not feeling well    No facility-administered encounter medications on file as of 03/14/2022.   NOT taking paxlovid prior to today's visit  Allergies  Allergen Reactions   Penicillins Anaphylaxis and Hives    Mouth becomes swollen, trouble swallowing   Savella [Milnacipran Hcl] Other (See Comments)    "anxiety, constipation"   Pamelor [Nortriptyline Hcl]    Topamax [Topiramate]    ROS:  no chest pain, shortness of breath, cough.  Congestion, sore throat, headache, nausea, body aches and fever per HPI.  No other symptoms.    Observations/Objective:  Temp 99 F (37.2 C) (Oral)   Ht 5' 2.25" (1.581 m)   Wt 135 lb (61.2 kg)   BMI 24.49 kg/m   Well-appearing, pleasant female, in no distress. Voice sounds congested, but no sniffling, throat-clearing or coughing during the visit. She is alert and oriented. Exam is limited due to the virtual nature of the visit  Assessment and Plan:  COVID-19 virus infection - risks/SE of paxlovid reviewed, as well as supportive measures, masking/isolation recommendations - Plan: nirmatrelvir/ritonavir (PAXLOVID) 20 x 150 MG & 10 x '100MG'$  TABS  Discussed interaction with sertraline--may increase sertraline levels, risk of QT prolongation. Also decreases tizanidine, cyclobenzaprine levels   Stay well hydrated. Use tylenol and/or ibuprofen as needed for fever, headache, body aches. If you develop any sinus pressure or pain, consider doing sinus rinses, or taking a decongestant such as pseudoephedrine. Consider Mucinex (plain or DM) if you have thick mucus or phlegm, and especially if you start coughing. (DM version has a cough suppressant, the plain mucinex is just an expectorant)  Take the Paxlovid as directed.  Cut the sertraline  dose in half for the 5 days that you take the medication (as it can increase the sertraline level, which might make you feel worse).  First day of symptoms is Day 0 (1/6) Isolate days 1-5 (1/7-1/11) On day 6 (1/12), if you no longer have a fever, and your respiratory symptoms are improving, you can leave isolation, but remain masked up for days 6-10 (no eating around others). If you still have a fever, or get a recurrent fever, then you should continue isolation. If you get better, but develop recurrent symptoms (fever, cough, etc), there is a small chance of having a rebound infection, which would require isolating again (you would again test positive for COVID).    Follow Up Instructions:    I discussed the assessment and treatment plan with the patient. The patient was provided an opportunity to ask questions and all were answered. The patient agreed with the plan and demonstrated an understanding of the instructions.   The patient was advised to call back or seek an in-person evaluation if the symptoms worsen or if the condition fails to  improve as anticipated.  I spent 26 minutes dedicated to the care of this patient, including pre-visit review of records, face to face time, post-visit ordering of testing and documentation.    Vikki Ports, MD

## 2022-03-14 NOTE — Patient Instructions (Signed)
Stay well hydrated. Use tylenol and/or ibuprofen as needed for fever, headache, body aches. If you develop any sinus pressure or pain, consider doing sinus rinses, or taking a decongestant such as pseudoephedrine. Consider Mucinex (plain or DM) if you have thick mucus or phlegm, and especially if you start coughing. (DM version has a cough suppressant, the plain mucinex is just an expectorant)  Take the Paxlovid as directed.  Cut the sertraline dose in half for the 5 days that you take the medication (as it can increase the sertraline level, which might make you feel worse).  First day of symptoms is Day 0 (1/6) Isolate days 1-5 (1/7-1/11) On day 6 (1/12), if you no longer have a fever, and your respiratory symptoms are improving, you can leave isolation, but remain masked up for days 6-10 (no eating around others). If you still have a fever, or get a recurrent fever, then you should continue isolation. If you get better, but develop recurrent symptoms (fever, cough, etc), there is a small chance of having a rebound infection, which would require isolating again (you would again test positive for COVID).

## 2022-03-16 ENCOUNTER — Ambulatory Visit: Payer: Medicare Other | Admitting: Family Medicine

## 2022-03-21 ENCOUNTER — Encounter: Payer: Self-pay | Admitting: Family Medicine

## 2022-05-11 ENCOUNTER — Encounter: Payer: Self-pay | Admitting: Family Medicine

## 2022-05-11 ENCOUNTER — Ambulatory Visit: Payer: Medicare Other | Admitting: Family Medicine

## 2022-05-11 VITALS — BP 118/68 | HR 56 | Ht 63.0 in | Wt 144.0 lb

## 2022-05-11 DIAGNOSIS — Z711 Person with feared health complaint in whom no diagnosis is made: Secondary | ICD-10-CM | POA: Diagnosis not present

## 2022-05-11 DIAGNOSIS — L989 Disorder of the skin and subcutaneous tissue, unspecified: Secondary | ICD-10-CM

## 2022-05-11 NOTE — Patient Instructions (Signed)
Please try and avoid further irritating the lesion on your upper back. It appears to be scabbed at the moment, making it a little harder to determine what the underlying cause is. Use an antibacterial ointment (bacitracin, neosporin) twice daily until the scab is gone. Keep your appointment with your dermatologist next month; this may need to be biopsied.

## 2022-05-11 NOTE — Progress Notes (Signed)
Chief Complaint  Patient presents with   bump on neck    Pt complains of bump on back of neck, no pain, slight itchiness.    She noticed a bump on her upper back about 3 months ago. It itches intermittently. She tried to exfoliate it, rubs with a towel, but doesn't pick at it.  Tried lotion for a while also. She isn't aware of any significant change, other than feeling more raised. Her husband thought it was a pimple at first, but now says it isn't.  She had something similar on her chin that turned out to be a squamous cell cancer that started out similarly (thought it was a pimple).  She sees Dr. Delman Cheadle, and has an appointment in April.  She just wanted to make sure it wasn't anything else (worried about other possibilities, mentioning shingles, and other diagnoses).  Her other concern is that she got a generic for her eye drops (cost >$400) and is very concerned they substituted without telling her.  She apparently had a conversation with the eye doctor in the past about getting brand vs generic.  She is worried about having problems from the generic medication.  She already took it home and can't return it.  PMH, PSH, SH reviewed  Outpatient Encounter Medications as of 05/11/2022  Medication Sig Note   Biotin (BIOTIN 5000) 5 MG CAPS Take 1 capsule by mouth daily.    brimonidine (ALPHAGAN P) 0.1 % SOLN Place 1 drop into both eyes 2 (two) times daily.    Cholecalciferol (VITAMIN D3 PO) Take 3,000 Units by mouth.    Coenzyme Q10 (CO Q 10 PO) Take by mouth.    Evening Primrose Oil 500 MG CAPS Take 1 capsule by mouth daily.    famotidine (PEPCID) 40 MG tablet  12/20/2021: prn   Omega-3 350 MG CPDR Take 2 capsules by mouth daily.    pyridoxine (B-6) 100 MG tablet Take 100 mg by mouth daily.    vitamin B-12 (CYANOCOBALAMIN) 500 MCG tablet Take 500 mcg by mouth daily.    vitamin C (ASCORBIC ACID) 500 MG tablet Take 500 mg by mouth daily.    cyclobenzaprine (FLEXERIL) 10 MG tablet  (Patient not  taking: Reported on 03/14/2022) 05/11/2022: Too strong. Does not use this   hydrOXYzine (VISTARIL) 25 MG capsule  (Patient not taking: Reported on 12/20/2021) 05/11/2022: Prn.   loratadine (CLARITIN) 10 MG tablet Take 10 mg by mouth. (Patient not taking: Reported on 03/14/2022) 05/11/2022: prn   LORazepam (ATIVAN) 0.5 MG tablet Take 0.5-1 tablets (0.25-0.5 mg total) by mouth 2 (two) times daily as needed for anxiety. (Patient not taking: Reported on 03/14/2022) 05/11/2022: Prn.   PROAIR HFA 108 (90 Base) MCG/ACT inhaler  (Patient not taking: Reported on 12/20/2021) 10/27/2021: Has not had to use recently   sertraline (ZOLOFT) 50 MG tablet Take 1 tablet (50 mg total) by mouth at bedtime.    sucralfate (CARAFATE) 1 g tablet Take 1 g by mouth 4 (four) times daily -  with meals and at bedtime. (Patient not taking: Reported on 12/20/2021) 05/11/2022: prn   tiZANidine (ZANAFLEX) 2 MG tablet Take 1 tablet (2 mg total) by mouth every 6 (six) hours as needed for muscle spasms. (Patient not taking: Reported on 03/14/2022) 05/11/2022: prn   No facility-administered encounter medications on file as of 05/11/2022.   Allergies  Allergen Reactions   Penicillins Anaphylaxis and Hives    Mouth becomes swollen, trouble swallowing   Savella [Milnacipran Hcl] Other (See  Comments)    "anxiety, constipation"   Pamelor [Nortriptyline Hcl]    Topamax [Topiramate]    ROS:  no fever, chills, URI symptoms.  +skin lesion on back, no other concerns. She has had some eye twitching, and has chronic pain and anxiety, unchanged. Doing well overall, without other concerns   PHYSICAL EXAM:  BP 118/68   Pulse (!) 56   Ht '5\' 3"'$  (1.6 m)   Wt 144 lb (65.3 kg)   BMI 25.51 kg/m   Wt Readings from Last 3 Encounters:  05/11/22 144 lb (65.3 kg)  03/14/22 135 lb (61.2 kg)  12/20/21 136 lb (61.7 kg)   Pleasant female, in good spirits. She is alert and oriented. HEENT: conjunctiva and sclera are clear, EOMI Back: 3-4 mm raised lesion on L upper  back, with scabbed areas present.  No surrounding erythema or soft tissue swelling. No other suspicious lesions noted Psych: in good spirits, talkative/anxious overall.  Normal hygiene and grooming    ASSESSMENT/PLAN:  Skin lesion - on upper back.  Excoriated/inflamed with scabs, unable to tell underlying etiology.  Has appt with derm next month, to keep appt, poss bx.  Reassurred  Concern about drug reaction without diagnosis - pt got generic glaucoma med. Discussed in detail--likely DAW not written by ophtho.  To use it and contact them if any SE/problems  Discussed generic vs brand She was very anxious and upset about this--discussed that it is likely very similar and she won't have a problem. Pharmacy didn't do anything illegal if the doctor didn't mark the DAW on the rx, it may have been an oversight. If she develops any problems related to the eye drops, she should contact her eye doctor, and advise both them and the pharmacy to notate she wants DAW only.   Please try and avoid further irritating the lesion on your upper back. It appears to be scabbed at the moment, making it a little harder to determine what the underlying cause is. Use an antibacterial ointment (bacitracin, neosporin) twice daily until the scab is gone. Keep your appointment with your dermatologist next month; this may need to be biopsied.

## 2022-05-24 ENCOUNTER — Encounter: Payer: Self-pay | Admitting: Family Medicine

## 2022-05-25 ENCOUNTER — Ambulatory Visit: Payer: Medicare Other | Admitting: Family Medicine

## 2022-05-25 ENCOUNTER — Encounter: Payer: Self-pay | Admitting: Family Medicine

## 2022-05-25 VITALS — BP 124/76 | HR 72 | Ht 63.0 in | Wt 145.4 lb

## 2022-05-25 DIAGNOSIS — M797 Fibromyalgia: Secondary | ICD-10-CM

## 2022-05-25 DIAGNOSIS — F411 Generalized anxiety disorder: Secondary | ICD-10-CM | POA: Diagnosis not present

## 2022-05-25 DIAGNOSIS — R0781 Pleurodynia: Secondary | ICD-10-CM

## 2022-05-25 NOTE — Patient Instructions (Addendum)
Use tylenol as needed to treat any pain. You can also try heat vs ice (whichever seems to help the most, vs alternating). Topical medications such as SalonPas with lidocaine, or Biofreeze or Icy Hot might help with your discomfort. You may go back to advil, if these other measures aren't helping.  Contact us if your pain worsens, if you have swelling, shortness of breath, new bruising, or other concerns.  Avoid a lot of twisting, bending, reaching. Listen to your body.  When you are ready to work more on your anxiety, I recommend that you start with counseling, and see a psychiatrist if you think medications are needed (since I wasn't able to help, and you have tried to many medications in the past).

## 2022-05-25 NOTE — Progress Notes (Signed)
Chief Complaint  Patient presents with   Fall    Has a fall on 05/12/22 hurt her left lower back rib and she is still sore. Tried heat and ice, stretching and massage. Advil and zanaflex. Has not taken anything in the last 24hrs for the pain.  Exercise is bothering her.    3/7 she slipped on a wet step on her deck.  She fell down 1 step, hit her left ribs on the step above. Denies any bruising of the skin. She has a "deep pain". Pain with pushing/pulling related to yoga and other activities. She has been able to walk and do limited stretching. Hurts with deep breaths.  She took advil 200 mg twice daily, last week, when it seemed to get worse. Didn't help much, didn't want to bother her stomach, so she stopped it. Hasn't taken any tizanidine since last week.  Pain 5-6/10, when seated. Certain movements aggravate it, up to 8/10.  Anxiety:  she reports she developed some facial tics a couple of months after being on sertraline, 1/2 pill, didn't see benefit, so stopped it in 03/2022.  She does state she would like to at some point try something else. Having anxiety related to life changes.  Her husband won't move, she would like to move somewhere closer to the city, less driving.  Mentioned multiples times not having kids or grandchildren (I couldn't quite understand the context of how this relates to her anxiety). She isn't part of a church right now, which has been important to her.   PMH, PSH, SH reviewed  Outpatient Encounter Medications as of 05/25/2022  Medication Sig Note   Biotin (BIOTIN 5000) 5 MG CAPS Take 1 capsule by mouth daily.    brimonidine (ALPHAGAN P) 0.1 % SOLN Place 1 drop into both eyes 2 (two) times daily.    Cholecalciferol (VITAMIN D3 PO) Take 3,000 Units by mouth.    Coenzyme Q10 (CO Q 10 PO) Take by mouth.    Evening Primrose Oil 500 MG CAPS Take 1 capsule by mouth daily.    Omega-3 350 MG CPDR Take 2 capsules by mouth daily.    pyridoxine (B-6) 100 MG tablet Take  100 mg by mouth daily.    vitamin B-12 (CYANOCOBALAMIN) 500 MCG tablet Take 500 mcg by mouth daily.    vitamin C (ASCORBIC ACID) 500 MG tablet Take 500 mg by mouth daily.    cyclobenzaprine (FLEXERIL) 10 MG tablet  (Patient not taking: Reported on 03/14/2022) 05/25/2022: Does not use, too strong   famotidine (PEPCID) 40 MG tablet  (Patient not taking: Reported on 05/25/2022) 05/25/2022: As needed   hydrOXYzine (VISTARIL) 25 MG capsule  (Patient not taking: Reported on 12/20/2021) 05/25/2022: As needed   loratadine (CLARITIN) 10 MG tablet Take 10 mg by mouth. (Patient not taking: Reported on 03/14/2022) 05/25/2022: As needed   LORazepam (ATIVAN) 0.5 MG tablet Take 0.5-1 tablets (0.25-0.5 mg total) by mouth 2 (two) times daily as needed for anxiety. (Patient not taking: Reported on 03/14/2022) 05/25/2022: As needed   PROAIR HFA 108 (90 Base) MCG/ACT inhaler  (Patient not taking: Reported on 12/20/2021) 05/25/2022: As needed   sucralfate (CARAFATE) 1 g tablet Take 1 g by mouth 4 (four) times daily -  with meals and at bedtime. (Patient not taking: Reported on 12/20/2021) 05/25/2022: As needed   tiZANidine (ZANAFLEX) 2 MG tablet Take 1 tablet (2 mg total) by mouth every 6 (six) hours as needed for muscle spasms. (Patient not taking: Reported on  03/14/2022) 05/25/2022: Last dose last week   [DISCONTINUED] sertraline (ZOLOFT) 50 MG tablet Take 1 tablet (50 mg total) by mouth at bedtime. (Patient not taking: Reported on 05/25/2022) 05/25/2022: Stopped taking in Jan.   No facility-administered encounter medications on file as of 05/25/2022.     Allergies  Allergen Reactions   Penicillins Anaphylaxis and Hives    Mouth becomes swollen, trouble swallowing   Savella [Milnacipran Hcl] Other (See Comments)    "anxiety, constipation"   Pamelor [Nortriptyline Hcl]    Topamax [Topiramate]    ROS:  no f/c, URI symptoms.  Allergies are controlled with prn antihistamine (some itchy eyes). No cough, shortness of breath. No  n/v/d No urinary complaints. Some anxiety, but not overwhelming. +chronic pain, fibromyalgia.  Stretching/rolling helps, but hasn't been able to do some of this since her fall.    PHYSICAL EXAM:   BP 124/76   Pulse 72   Ht 5\' 3"  (1.6 m)   Wt 145 lb 6.4 oz (66 kg)   BMI 25.76 kg/m   Wt Readings from Last 3 Encounters:  05/25/22 145 lb 6.4 oz (66 kg)  05/11/22 144 lb (65.3 kg)  03/14/22 135 lb (61.2 kg)   Pleasant, talkative female in good spirits. HEENT: conjunctiva and sclera are clear, EOMI Neck: no lymphadenopathy or mass Heart: regular rate and rhtyhm Lungs: clear bilaterally Abdomen: soft, nontender, no mass Back: no spinal tenderness. She has some diffuse tenderness bilaterally (due to known fibromyalgia), though somewhat more on the left.  Left ribs slightly more prominent, but no bony stepoffs. Skin is intact, no bruising, erythema or soft tissue swelling Extremities: no edema  Rib pain on left side - related to fal on 3/7.  suspect contusion, nothing to suggest fracture. Supportive care discussed in detail  Generalized anxiety disorder - Intolerant of many meds.  Discussed recommendation for counseling; consider psychiatrist if she truly wants medication  Fibromyalgia   Use tylenol as needed to treat any pain. You can also try heat vs ice (whichever seems to help the most, vs alternating). Topical medications such as SalonPas with lidocaine, or Biofreeze or Icy Hot might help with your discomfort. You may go back to advil, if these other measures aren't helping.  Contact us if your pain worsens, if you have swelling, shortness of breath, new bruising, or other concerns.  Avoid a lot of twisting, bending, reaching. Listen to your body.   When you are ready to work more on your anxiety, I recommend that you start with counseling, and see a psychiatrist if you think medications are needed (since I wasn't able to help, and you have tried to many medications in  the past).

## 2022-06-17 ENCOUNTER — Other Ambulatory Visit: Payer: Self-pay | Admitting: Family Medicine

## 2022-06-17 DIAGNOSIS — F411 Generalized anxiety disorder: Secondary | ICD-10-CM

## 2022-06-17 NOTE — Telephone Encounter (Signed)
Pt stopped this in January as she did not see benefit

## 2022-07-29 ENCOUNTER — Encounter: Payer: Self-pay | Admitting: Family Medicine

## 2022-08-04 ENCOUNTER — Encounter: Payer: Self-pay | Admitting: Family Medicine

## 2022-08-04 ENCOUNTER — Ambulatory Visit: Payer: Medicare Other | Admitting: Family Medicine

## 2022-08-04 VITALS — BP 112/70 | HR 60 | Temp 98.2°F | Ht 63.0 in | Wt 143.2 lb

## 2022-08-04 DIAGNOSIS — M797 Fibromyalgia: Secondary | ICD-10-CM | POA: Diagnosis not present

## 2022-08-04 DIAGNOSIS — L237 Allergic contact dermatitis due to plants, except food: Secondary | ICD-10-CM | POA: Diagnosis not present

## 2022-08-04 NOTE — Patient Instructions (Signed)
Avoidance is key.  Consider the sleeves vs long sleeve shirts and gloves, and put everything right into the laundry machine.  If you get a rash, please seek evaluation sooner so that we can perhaps make a difference and shorten the course with a prescription strength cream.

## 2022-08-04 NOTE — Progress Notes (Signed)
Chief Complaint  Patient presents with   Rash    Patient states she has had some poison ivy on her right arm x 4 weeks. It is putting her nerve endings on fire. Using Serna lotion, Cortison 10 and benadyl.    The first week in May she started with a rash on the anterior aspect of the R forearm only.  She had been doing yardwork, known poison ivy exposure.  She reports she gets it yearly. She noticed some spread on the forearm, but nowhere else on the body. She has been using Sarna and HC cream, and children's Benadryl at night. Last week it was still scaly, itchy.  Over the last weekend, it has calmed down, and has been fading. Yesterday and today it hasn't been itchy (as long as she doesn't touch it).  It is very sensitive to the touch--it itches more and is somewhat painful.  She reports she gets poison ivy yearly, this year it got "worse and worse and worse and it got into my nerve endings and lit me up head to toe." She reports the inflammation even got into her eyes (not the poison ivy rash). She reports that her "electrical system went haywire".  Her fibromyalgia flared badly. She reports noting swelling in both legs/arms (could see sock marks on legs).  She states that it is all getting better now.  Wanting to know what she can do in the future if this happens, to try and nip it in the bud. She has many questions about how this is affecting her nerves, how she ended up with a rash around her anus that responded to treatment with Nystatin by GYN (was this related to her poison ivy/nerve reaction).    PMH, PSH, SH reviewed   Outpatient Encounter Medications as of 08/04/2022  Medication Sig Note   Biotin (BIOTIN 5000) 5 MG CAPS Take 1 capsule by mouth daily.    brimonidine (ALPHAGAN P) 0.1 % SOLN Place 1 drop into both eyes 2 (two) times daily.    Cholecalciferol (VITAMIN D3 PO) Take 3,000 Units by mouth.    Coenzyme Q10 (CO Q 10 PO) Take by mouth.    diphenhydrAMINE (BENADRYL) 12.5  MG/5ML elixir Take 12.5 mg by mouth 4 (four) times daily as needed. 08/04/2022: Last dose 9pm   Evening Primrose Oil 500 MG CAPS Take 1 capsule by mouth daily.    Omega-3 350 MG CPDR Take 2 capsules by mouth daily.    PROAIR HFA 108 (90 Base) MCG/ACT inhaler  08/04/2022: Used yesterday while at the gym   pyridoxine (B-6) 100 MG tablet Take 100 mg by mouth daily.    vitamin B-12 (CYANOCOBALAMIN) 500 MCG tablet Take 500 mcg by mouth daily.    vitamin C (ASCORBIC ACID) 500 MG tablet Take 500 mg by mouth daily.    famotidine (PEPCID) 40 MG tablet  (Patient not taking: Reported on 05/25/2022) 08/04/2022: As needed   hydrOXYzine (VISTARIL) 25 MG capsule  (Patient not taking: Reported on 12/20/2021) 08/04/2022: As needed   loratadine (CLARITIN) 10 MG tablet Take 10 mg by mouth. (Patient not taking: Reported on 03/14/2022) 08/04/2022: As needed   LORazepam (ATIVAN) 0.5 MG tablet Take 0.5-1 tablets (0.25-0.5 mg total) by mouth 2 (two) times daily as needed for anxiety. (Patient not taking: Reported on 03/14/2022) 08/04/2022: As needed, last dose 01/05/22   sucralfate (CARAFATE) 1 g tablet Take 1 g by mouth 4 (four) times daily -  with meals and at bedtime. (Patient not  taking: Reported on 12/20/2021) 08/04/2022: As needed, several months ago   tiZANidine (ZANAFLEX) 2 MG tablet Take 1 tablet (2 mg total) by mouth every 6 (six) hours as needed for muscle spasms. (Patient not taking: Reported on 03/14/2022) 08/04/2022: As needed, about a month ago   [DISCONTINUED] cyclobenzaprine (FLEXERIL) 10 MG tablet  (Patient not taking: Reported on 03/14/2022) 05/25/2022: Does not use, too strong   No facility-administered encounter medications on file as of 08/04/2022.   Allergies  Allergen Reactions   Penicillins Anaphylaxis and Hives    Mouth becomes swollen, trouble swallowing   Savella [Milnacipran Hcl] Other (See Comments)    "anxiety, constipation"   Pamelor [Nortriptyline Hcl]    Topamax [Topiramate]    ROS: no fever, chills,  dizziness, chest pain, shortness of breath. +rash on R forearm.  Overall body "inflammation", swelling of arms/legs (resolved), flare of pain throughout her body. Anal rash resolved. See HPI   PHYSICAL EXAM:  BP 112/70   Pulse 60   Temp 98.2 F (36.8 C) (Tympanic)   Ht 5\' 3"  (1.6 m)   Wt 143 lb 3.2 oz (65 kg)   BMI 25.37 kg/m   Wt Readings from Last 3 Encounters:  08/04/22 143 lb 3.2 oz (65 kg)  05/25/22 145 lb 6.4 oz (66 kg)  05/11/22 144 lb (65.3 kg)   Pleasant female, appears calm at first, rather animated in discussing her inflammation/symptoms. She is otherwise calm, in no acute distress (just seeming frustrated). HEENT: conjunctiva and sclera are clear, EOMI Neuro: alert and oriented, cranial nerves grossly intact. Normal gait. Skin: R anterior forearm, some areas of erythema, slightly dry. It is pink, not warm, no vesicles present, no crusting, swelling.   ASSESSMENT/PLAN:  Poison ivy dermatitis - limited to forearm, present x 4 weeks, and resolving without complication. Pt reassured. To present earlier for treatment (rx steroid); OTC measures reviewed  Fibromyalgia - seems to be flaring, exacerbated since poison ivy flare. Pt reassured no direct relation of poison ivy to nerve issues or diffuse pain  All questions answered to the best of my ability. Discussed the importance of PREVENTION of poison ivy (since she gets it yearly), and earlier presentation for treatment.  Discussed the types of reactions--hers stays local, so no need for systemic steroids. Can use prescription steroids to local skin involvement with future flares. I'd avoid Sarna, and instead use topical benadryl/caladryl lotion, along with the steroid cream.

## 2022-08-08 ENCOUNTER — Other Ambulatory Visit: Payer: Self-pay | Admitting: Family Medicine

## 2022-08-08 NOTE — Telephone Encounter (Signed)
Is this okay to refill? 

## 2022-10-03 ENCOUNTER — Telehealth: Payer: Self-pay | Admitting: Family Medicine

## 2022-10-03 NOTE — Telephone Encounter (Signed)
I would rather not do labs in advance. She doesn't require yearly labs (that would be covered by insurance), as she isn't on medications for blood pressure, diabetes or cholesterol.  She likely does need cholesterol check, A1c and fasting glucose, but other labs might be needed depending on her complaints. I would rather talk to her first and decide what is needed. She is the first appointment of the morning, so please have her come fasting. If she cannot, we can arrange for her to return for fasting labs another day (when she is here).

## 2022-10-03 NOTE — Telephone Encounter (Signed)
Pt has cpe 8/8/ and wants to come in prior for her fasting labs, please advise

## 2022-10-11 NOTE — Patient Instructions (Incomplete)
HEALTH MAINTENANCE RECOMMENDATIONS:  It is recommended that you get at least 30 minutes of aerobic exercise at least 5 days/week (for weight loss, you may need as much as 60-90 minutes). This can be any activity that gets your heart rate up. This can be divided in 10-15 minute intervals if needed, but try and build up your endurance at least once a week.  Weight bearing exercise is also recommended twice weekly.  Eat a healthy diet with lots of vegetables, fruits and fiber.  "Colorful" foods have a lot of vitamins (ie green vegetables, tomatoes, red peppers, etc).  Limit sweet tea, regular sodas and alcoholic beverages, all of which has a lot of calories and sugar.  Up to 1 alcoholic drink daily may be beneficial for women (unless trying to lose weight, watch sugars).  Drink a lot of water.  Calcium recommendations are 1200-1500 mg daily (1500 mg for postmenopausal women or women without ovaries), and vitamin D 1000 IU daily.  This should be obtained from diet and/or supplements (vitamins), and calcium should not be taken all at once, but in divided doses.  Monthly self breast exams and yearly mammograms for women over the age of 84 is recommended.  Sunscreen of at least SPF 30 should be used on all sun-exposed parts of the skin when outside between the hours of 10 am and 4 pm (not just when at beach or pool, but even with exercise, golf, tennis, and yard work!)  Use a sunscreen that says "broad spectrum" so it covers both UVA and UVB rays, and make sure to reapply every 1-2 hours.  Remember to change the batteries in your smoke detectors when changing your clock times in the spring and fall. Carbon monoxide detectors are recommended for your home.  Use your seat belt every time you are in a car, and please drive safely and not be distracted with cell phones and texting while driving.   Ms. Czaja , Thank you for taking time to come for your Medicare Wellness Visit. I appreciate your ongoing  commitment to your health goals. Please review the following plan we discussed and let me know if I can assist you in the future.   This is a list of the screening recommended for you and due dates:  Health Maintenance  Topic Date Due   Hepatitis C Screening  Never done   Zoster (Shingles) Vaccine (1 of 2) 10/18/1969   COVID-19 Vaccine (5 - 2023-24 season) 11/05/2021   Flu Shot  10/06/2022   Pneumonia Vaccine (2 of 2 - PPSV23 or PCV20) 10/28/2022*   Mammogram  12/25/2022   Medicare Annual Wellness Visit  10/13/2023   DTaP/Tdap/Td vaccine (3 - Td or Tdap) 11/23/2024   Colon Cancer Screening  08/13/2025   DEXA scan (bone density measurement)  Completed   HPV Vaccine  Aged Out  *Topic was postponed. The date shown is not the original due date.   Flu shots are recommended every Fall. The newly updated COVID booster is recommended--this should be available in the Fall.  RSV vaccine is recommended, please get this in the Fall, from the pharmacy.  I recommend getting the new shingles vaccine (Shingrix). Since you have Medicare, you will need to get this from the pharmacy, as it is covered by Part D. This is a series of 2 injections, spaced 2 months apart.   This should be separated from other vaccines by at least 2 weeks.  Prevnar-20 (pneumonia vaccine) is recommended. This is only needed  once, with no further boosters. This can be given in our office, or from the pharmacy.  Please discuss your osteoporosis and bone density with your GYN.  You are due for a follow-up bone density test (previously ordered by Dr. Vincente Poli). Be sure to get adequate calcium in your diet (and supplements to make up the rest), vitamin D, and weight-bearing exercise at least 2x/week.  Please bring [place] copies of your Living Will and Healthcare Power of Attorney once completed and notarized so that it can be scanned into your medical chart.

## 2022-10-11 NOTE — Progress Notes (Unsigned)
No chief complaint on file.  Brenda Smith is a 72 y.o. female who presents for annual physical exam, Medicare wellness visit and follow-up on chronic medical conditions.  She has the following concerns:  Anxiety:  Last discussed in 12/2021. At that time she was taking sertraline 50 mg at bedtime, and still using lorazepam regularly. She was encouraged to see psychiatrist and/or therapist to help her manage anxiety.  She previously reported a lot of nausea, suspected it was related to GERD. Reviewed diet, and recommended restarting famotidine (either 20mg  ac meals, BID, vs 40 mg before dinner). She had reported some early satiety, but no observed weight loss. We had discussed recommendation to f/u with Dr. Loreta Ave if persistent problems.   Osteoporosis:  DEXA 11/2020 T-2.6 R fem neck, T-2.3 spine, L fem neck. DEXA was ordered by Dr. Vincente Poli.  DISCUSSED TREATMENT??? Calcium? D? Wt-bearing exercise??  Vitamin D level was 32.4 in 10/2021, when taking 3000 IU daily. She was advised to increase the amount you take by an extra 1000 IU  over the winter months (October through March), as it will likely drop. (Versus taking extra all year round, which is also fine).   Hyperlipidemia:  Last LDL was 148 in 02/2021. Due for recheck today. Current diet includes UPDATE  Pre-diabetes:  A1c has been 5.9% She tries to limit carbs and sugar in her diet.  UPDATE  Immunization History  Administered Date(s) Administered   Influenza Split 01/09/2012, 11/28/2012   Influenza,inj,Quad PF,6+ Mos 11/26/2015, 01/30/2017, 12/12/2017, 11/30/2018, 12/03/2019, 12/01/2020, 01/04/2022   Influenza,inj,quad, With Preservative 11/18/2013, 01/26/2015   Influenza-Unspecified 01/09/2012, 11/28/2012, 11/18/2013, 01/26/2015, 11/26/2015, 01/30/2017, 12/12/2017, 11/30/2018, 12/15/2018, 12/03/2019, 12/01/2020   PFIZER(Purple Top)SARS-COV-2 Vaccination 04/11/2019, 05/07/2019, 03/24/2020   Pneumococcal Conjugate-13 11/24/2014   Td  03/01/2005   Tdap 11/24/2014   Unspecified SARS-COV-2 Vaccination 03/24/2020   Zoster, Live 08/25/2011   Last Pap smear: 12/2021 Dr. Vincente Poli Last mammogram: 12/2021 Last colonoscopy: 07/2020 sessile serrated adenoma/polyp. 5 year f/u recommended (Dr. Loreta Ave) Last DEXA:   11/2020 T-2.6 R fem neck, -2.3 spine, L fem neck. Dentist: Ophtho: Exercise:  Prior labs: 02/2021 labs LDL 148, HDL 69, A1c 5.9, glu 98, nl chem; vit D 35 04/2020, nl ferritin, B12, TSH  Lab Results  Component Value Date   HGBA1C 5.9 (H) 10/27/2021     Patient Care Team: Joselyn Arrow, MD as PCP - General (Family Medicine) Rollene Rotunda, MD as PCP - Cardiology (Cardiology)  GYN: Dr. Vincente Poli GI: Dr. Loreta Ave Dentist: Ophtho: Derm: Dr. Emily Filbert Ortho: Dr. August Saucer   Depression Screening: Flowsheet Row Clinical Support from 12/20/2021 in [facility]  PHQ-2 Total Score 0       Flowsheet Row Clinical Support from 12/20/2021 in [facility]  PHQ-9 Total Score 4         12/20/2021   10:58 AM 10/27/2021   10:54 AM 08/19/2021   10:31 AM  GAD 7 : Generalized Anxiety Score  Nervous, Anxious, on Edge 1 3 1   Control/stop worrying 0 2 0  Worry too much - different things 0 1 0  Trouble relaxing 0 1 1  Restless 0 0 1  Easily annoyed or irritable 0 1 1  Afraid - awful might happen 0 0 0  Total GAD 7 Score 1 8 4   Anxiety Difficulty Not difficult at all Somewhat difficult       Falls screen:     05/25/2022    3:39 PM 05/11/2022   11:01 AM 09/13/2021   12:17 PM  Fall Risk   Falls in the past year? 1 0 0  Number falls in past yr: 1 0 0  Comment 11/23 fell in gopher hole, 05/12/22 fell down step    Injury with Fall? 1 0 0  Comment gopher hole no injury and step left lower back/rib pain    Risk for fall due to : History of fall(s) No Fall Risks No Fall Risks  Follow up Falls evaluation completed Falls evaluation completed Falls evaluation completed     Functional Status Survey:          End of Life Discussion:  Patient {ACTIONS; HAS/DOES NOT HAVE:19233} a living will and medical power of attorney   PMH, PSH, SH and FH were reviewed and updated   ROS:  Patient denies anorexia, fever, weight changes, headaches, vision changes, decreased hearing, URI symptoms, breast concerns, chest pain, palpitations, dizziness, syncope, dyspnea on exertion, cough, swelling, nausea, vomiting, diarrhea, constipation, abdominal pain, melena, hematochezia, indigestion/heartburn, hematuria, incontinence, dysuria, vaginal bleeding, discharge, odor or itch, genital lesions, joint pains, numbness, tingling, weakness, tremor, suspicious skin lesions, depression, anxiety, abnormal bleeding/bruising, or enlarged lymph nodes.  UPDATE ALL***  Nausea, early satiety, GERD? Muscle pains Joint pains   PHYSICAL EXAM:  There were no vitals taken for this visit.  Wt Readings from Last 3 Encounters:  08/04/22 143 lb 3.2 oz (65 kg)  05/25/22 145 lb 6.4 oz (66 kg)  05/11/22 144 lb (65.3 kg)    General Appearance:    Alert, cooperative, no distress, appears stated age  Head:    Normocephalic, without obvious abnormality, atraumatic  Eyes:    PERRL, conjunctiva/corneas clear, EOM's intact, fundi    benign  Ears:    Normal TM's and external ear canals  Nose:   Nares normal, mucosa normal, no drainage or sinus   tenderness  Throat:   Lips, mucosa, and tongue normal; teeth and gums normal  Neck:   Supple, no lymphadenopathy;  thyroid:  no enlargement/ tenderness/nodules; no carotid bruit or JVD  Back:    Spine nontender, no curvature, ROM normal, no CVA     tenderness  Lungs:     Clear to auscultation bilaterally without wheezes, rales or     ronchi; respirations unlabored  Chest Wall:    No tenderness or deformity   Heart:    Regular rate and rhythm, S1 and S2 normal, no murmur, rub   or gallop  Breast Exam:    Deferred to GYN  Abdomen:     Soft, non-tender, nondistended, normoactive bowel sounds,     no masses, no hepatosplenomegaly  Genitalia:    Deferred to GYN     Extremities:   No clubbing, cyanosis or edema  Pulses:   2+ and symmetric all extremities  Skin:   Skin color, texture, turgor normal, no rashes or lesions  Lymph nodes:   Cervical, supraclavicular, and axillary nodes normal  Neurologic:   CNII-XII intact, normal strength, sensation and gait; reflexes 2+ and symmetric throughout          Psych:   Normal mood, affect, hygiene and grooming.      **** UPDATE ALL   ASSESSMENT/PLAN:  Phq9, gad7  TSH only if sx, and need to pair with sx (or not covered). Can't use physical to cover any labs for medicare patients, always need another dx (Med monitor works for a lot of them)  Prevnar-20 today (never had pneumovax; only had prevnar-13, back in 2016) She had declined getting pneumovax in 09/2021.  My current rec is for prevnar-20 (one and done!, no further pneumonia shots after this one!)  Shingrix and RSV rec from pharmacy COVID booster and flu shots in the fall (she previously had reaction to high dose, only wants normal dose)  REFUSE/DECLINE WHATEVER VACCINES SHE STATES SHE WON'T GET  Osteoporosis noted on DEXA 08/2020 from GYN.  WAS TREATMENT EVER RECOMMENDED/DISCUSSED?? DEXA DUE NOW--to d/w Dr. Vincente Poli at next visit. SCHEDULE??? Rec repeat D in future if worsening osteoporosis  Discussed monthly self breast exams and yearly mammograms; at least 30 minutes of aerobic activity at least 5 days/week and weight-bearing exercise 2x/week; proper sunscreen use reviewed; healthy diet, including goals of calcium and vitamin D intake and alcohol recommendations (less than or equal to 1 drink/day) reviewed; regular seatbelt use; changing batteries in smoke detectors, use of carbon monoxide detectors.  Immunization recommendations discussed.   Prevnar-20 Flu shot (pt prefers to continue with regular dose, rather than high dose) in the Fall. Discussed recommendation for updated  COVID booster when available in the Fall, as well as RSV vaccine in the Fall. Shingrix recommended from the pharmacy. Colonoscopy recommendations reviewed, due again 07/2025 (5 year f/u with Dr. Loreta Ave). DEXA is due to be repeated--to d/w GYN.     Medicare Attestation I have personally reviewed: The patient's medical and social history Their use of alcohol, tobacco or illicit drugs Their current medications and supplements The patient's functional ability including ADLs,fall risks, home safety risks, cognitive, and hearing and visual impairment Diet and physical activities Evidence for depression or mood disorders  The patient's weight, height, BMI have been recorded in the chart.  I have made referrals, counseling, and provided education to the patient based on review of the above and I have provided the patient with a written personalized care plan for preventive services.     Lavonda Jumbo, MD

## 2022-10-13 ENCOUNTER — Ambulatory Visit: Payer: Medicare Other | Admitting: Family Medicine

## 2022-10-13 ENCOUNTER — Encounter: Payer: Self-pay | Admitting: Family Medicine

## 2022-10-13 VITALS — BP 118/74 | HR 64 | Ht 63.0 in | Wt 144.4 lb

## 2022-10-13 DIAGNOSIS — Z23 Encounter for immunization: Secondary | ICD-10-CM

## 2022-10-13 DIAGNOSIS — M797 Fibromyalgia: Secondary | ICD-10-CM

## 2022-10-13 DIAGNOSIS — Z1159 Encounter for screening for other viral diseases: Secondary | ICD-10-CM | POA: Diagnosis not present

## 2022-10-13 DIAGNOSIS — R7303 Prediabetes: Secondary | ICD-10-CM | POA: Insufficient documentation

## 2022-10-13 DIAGNOSIS — M810 Age-related osteoporosis without current pathological fracture: Secondary | ICD-10-CM

## 2022-10-13 DIAGNOSIS — E7800 Pure hypercholesterolemia, unspecified: Secondary | ICD-10-CM | POA: Diagnosis not present

## 2022-10-13 DIAGNOSIS — Z0000 Encounter for general adult medical examination without abnormal findings: Secondary | ICD-10-CM | POA: Diagnosis not present

## 2022-10-13 DIAGNOSIS — F411 Generalized anxiety disorder: Secondary | ICD-10-CM

## 2022-10-13 DIAGNOSIS — Z5181 Encounter for therapeutic drug level monitoring: Secondary | ICD-10-CM

## 2022-10-13 DIAGNOSIS — R911 Solitary pulmonary nodule: Secondary | ICD-10-CM

## 2022-10-13 LAB — CMP14+EGFR
ALT: 15 IU/L (ref 0–32)
AST: 19 IU/L (ref 0–40)
Albumin: 4.6 g/dL (ref 3.8–4.8)
Alkaline Phosphatase: 88 IU/L (ref 44–121)
BUN/Creatinine Ratio: 9 — ABNORMAL LOW (ref 12–28)
BUN: 8 mg/dL (ref 8–27)
Bilirubin Total: 0.5 mg/dL (ref 0.0–1.2)
CO2: 24 mmol/L (ref 20–29)
Calcium: 10.1 mg/dL (ref 8.7–10.3)
Chloride: 103 mmol/L (ref 96–106)
Creatinine, Ser: 0.86 mg/dL (ref 0.57–1.00)
Globulin, Total: 2.2 g/dL (ref 1.5–4.5)
Glucose: 100 mg/dL — ABNORMAL HIGH (ref 70–99)
Potassium: 5.2 mmol/L (ref 3.5–5.2)
Sodium: 140 mmol/L (ref 134–144)
Total Protein: 6.8 g/dL (ref 6.0–8.5)
eGFR: 72 mL/min/{1.73_m2} (ref >59)

## 2022-10-13 LAB — CBC WITH DIFFERENTIAL/PLATELET
Basophils Absolute: 0.1 10*3/uL (ref 0.0–0.2)
Basos: 1 %
EOS (ABSOLUTE): 0.1 10*3/uL (ref 0.0–0.4)
Eos: 2 %
Hematocrit: 46 % (ref 34.0–46.6)
Hemoglobin: 15.1 g/dL (ref 11.1–15.9)
Immature Grans (Abs): 0 10*3/uL (ref 0.0–0.1)
Immature Granulocytes: 0 %
Lymphocytes Absolute: 1.9 10*3/uL (ref 0.7–3.1)
Lymphs: 34 %
MCH: 29.5 pg (ref 26.6–33.0)
MCHC: 32.8 g/dL (ref 31.5–35.7)
MCV: 90 fL (ref 79–97)
Monocytes Absolute: 0.5 10*3/uL (ref 0.1–0.9)
Monocytes: 8 %
Neutrophils Absolute: 3.1 10*3/uL (ref 1.4–7.0)
Neutrophils: 55 %
Platelets: 267 10*3/uL (ref 150–450)
RBC: 5.11 x10E6/uL (ref 3.77–5.28)
RDW: 12.6 % (ref 11.7–15.4)
WBC: 5.7 10*3/uL (ref 3.4–10.8)

## 2022-10-13 LAB — LIPID PANEL

## 2022-10-13 LAB — HEPATITIS C ANTIBODY

## 2022-10-13 LAB — HEMOGLOBIN A1C
Est. average glucose Bld gHb Est-mCnc: 126 mg/dL
Hgb A1c MFr Bld: 6 % — ABNORMAL HIGH (ref 4.8–5.6)

## 2022-10-13 NOTE — Assessment & Plan Note (Signed)
Really not exhibiting FM symptoms.  No trigger points. Describes sharp electrical pains periodically. Also described L hip pain that responded to NSAID and muscle relaxant, so suspect different cause, that resolved (sciatica? Radiculopathy? Bursitis or OA?).  Advised to schedule an acute visit when having pain like that in the future.

## 2022-10-13 NOTE — Assessment & Plan Note (Signed)
Recheck A1c.  Discussed proper diet. Continue whole grains and limited carbs, regular exercise.  Limiting sugar/sweets encouraged.

## 2022-10-13 NOTE — Assessment & Plan Note (Signed)
R femoral neck on DEXA 11/25/20 done by Dr. Vincente Poli. Discussed increased risk for fracture, the need for adequate calcium, vitamin D and weight-bearing exercise in order to prevent further loss. Discussed that these measures do not build bone. She is scheduled for repeat DEXA (ordered by GYN) next month. Counseled re: calcium sources, recommendations

## 2022-10-13 NOTE — Assessment & Plan Note (Signed)
Lipids 02/2021 LDL 148. Calcium score of 0 04/2021. Due for recheck. Reviewed lowfat, low cholesterol diet

## 2022-10-13 NOTE — Assessment & Plan Note (Signed)
She is no longer taking lorazepam (took it chronically). She is also no longer taking sertraline.  She has generalized anxiety, but not interested in medication, therapy or psychiatrist. Encouraged good self care. F/u if worsening

## 2022-10-13 NOTE — Assessment & Plan Note (Signed)
Incidental finding. Due for 2 year f/u CT in 11/2023

## 2022-10-27 ENCOUNTER — Institutional Professional Consult (permissible substitution): Payer: Medicare Other | Admitting: Family Medicine

## 2022-11-29 LAB — HM DEXA SCAN

## 2022-12-12 ENCOUNTER — Encounter: Payer: Self-pay | Admitting: *Deleted

## 2022-12-22 ENCOUNTER — Other Ambulatory Visit (INDEPENDENT_AMBULATORY_CARE_PROVIDER_SITE_OTHER): Payer: Medicare Other

## 2022-12-22 DIAGNOSIS — Z23 Encounter for immunization: Secondary | ICD-10-CM

## 2023-01-23 ENCOUNTER — Ambulatory Visit: Payer: Medicare Other | Admitting: Family Medicine

## 2023-01-23 ENCOUNTER — Encounter: Payer: Self-pay | Admitting: Family Medicine

## 2023-01-23 VITALS — BP 134/72 | HR 68 | Ht 63.0 in | Wt 147.2 lb

## 2023-01-23 DIAGNOSIS — M797 Fibromyalgia: Secondary | ICD-10-CM

## 2023-01-23 DIAGNOSIS — F411 Generalized anxiety disorder: Secondary | ICD-10-CM | POA: Diagnosis not present

## 2023-01-23 DIAGNOSIS — M25552 Pain in left hip: Secondary | ICD-10-CM

## 2023-01-23 DIAGNOSIS — R109 Unspecified abdominal pain: Secondary | ICD-10-CM | POA: Diagnosis not present

## 2023-01-23 LAB — POCT URINALYSIS DIP (PROADVANTAGE DEVICE)
Bilirubin, UA: NEGATIVE
Blood, UA: NEGATIVE
Glucose, UA: NEGATIVE mg/dL
Ketones, POC UA: NEGATIVE mg/dL
Leukocytes, UA: NEGATIVE
Nitrite, UA: NEGATIVE
Protein Ur, POC: NEGATIVE mg/dL
Specific Gravity, Urine: 1.01
Urobilinogen, Ur: 0.2
pH, UA: 6 (ref 5.0–8.0)

## 2023-01-23 NOTE — Progress Notes (Signed)
Chief Complaint  Patient presents with   Consult    Discuss lab results from [facility] back in August. Hip pain that started on the right side(bottom of rib cage), now on the left, shooting pain.   Patient presents with complaint of recent pain that started at R flank, and then moved over to her left hip. This was 2 weeks ago.  She would like to review her labs from August, wondering if any of the "ups and downs" she saw could have anything to do with her current complaints.  She reports she had "migraine level pain" at R flank the first week in November, lasted for 2 days. She took Tizanidine, didn't help much.  Used ice and heat, only temporary improvement. Pain "just moved"--resolved at R flank, but developed pain at L lateral hip. She took cyclobenzaprine that sh had from Dr. Hyman Hopes (old rx from [place] from 2022), which eased off the hip pain. L hip pain lasted 3 days. She had 5 days/nights of discomfort.  She also reports she had taken meloxicam 1/2 tablet for the hip pain, which also helped. She took a total of 2 doses, 1/2 tablet at a time. Meds rx'd in 2022 for fibromyalgia (the meloxicam and cyclobenzaprine). She also has tizanidine, knows not to take both muscle relaxants together.  She ports today that she has some muscle aching/soreness. She admits to pushing herself at the gym. She is walking and stretching at the health club. She finds that stretching (after activity) makes her discomfort worse. Can't use a roller on her back, too painful.  Started with some R flank pain again this weekend, took 1/2 cyclobenzrine with good rsults.  She states that her level of pain is getting unmanageable.  "This is an electrical storm". She wants to know what is causing her pain--is it "electrical", muscular, inflammatory, related to anxiety, etc. She has known diagnosis of fibromyalgia. She has been paying out of pocket for massages and accupuncture. She previously went to [facility]  reports it was sold, is very different now, and she wouldn't want to go back.  She currently feels worn out, like after a migraine. Having some heartburn, doesn't like how the meds affect her. Hasn't tolerated other medications for fibromyalgia in past, see prior notes.    PMH, PSH, SH reviewed  Outpatient Encounter Medications as of 01/23/2023  Medication Sig Note   Biotin (BIOTIN 5000) 5 MG CAPS Take 1 capsule by mouth daily.    brimonidine (ALPHAGAN P) 0.1 % SOLN Place 1 drop into both eyes 2 (two) times daily.    Cholecalciferol (VITAMIN D3 PO) Take 3,000 Units by mouth.    Coenzyme Q10 (CO Q 10 PO) Take by mouth.    cyclobenzaprine (FLEXERIL) 10 MG tablet Take 10 mg by mouth 3 (three) times daily as needed for muscle spasms. 01/23/2023: Took this weekend for hip   Evening Primrose Oil 500 MG CAPS Take 1 capsule by mouth daily.    Omega-3 350 MG CPDR Take 2 capsules by mouth daily.    PROAIR HFA 108 (90 Base) MCG/ACT inhaler  01/23/2023: As needed   pyridoxine (B-6) 100 MG tablet Take 100 mg by mouth daily.    sucralfate (CARAFATE) 1 g tablet Take 1 g by mouth 4 (four) times daily -  with meals and at bedtime. 01/23/2023: Last dose 3 weeks ago   tiZANidine (ZANAFLEX) 2 MG tablet Take 1 tablet (2 mg total) by mouth every 8 (eight) hours as needed for muscle  spasms. 01/23/2023: Took last week x 3 days for hip pain   vitamin B-12 (CYANOCOBALAMIN) 500 MCG tablet Take 500 mcg by mouth daily.    vitamin C (ASCORBIC ACID) 500 MG tablet Take 500 mg by mouth daily.    diphenhydrAMINE (BENADRYL) 12.5 MG/5ML elixir Take 12.5 mg by mouth 4 (four) times daily as needed. (Patient not taking: Reported on 10/13/2022) 01/23/2023: As needed   famotidine (PEPCID) 40 MG tablet  (Patient not taking: Reported on 05/25/2022) 01/23/2023: As needed   hydrOXYzine (VISTARIL) 25 MG capsule  (Patient not taking: Reported on 12/20/2021) 01/23/2023: As needed   loratadine (CLARITIN) 10 MG tablet Take 10 mg by mouth.  (Patient not taking: Reported on 03/14/2022) 01/23/2023: As needed   LORazepam (ATIVAN) 0.5 MG tablet Take 0.5-1 tablets (0.25-0.5 mg total) by mouth 2 (two) times daily as needed for anxiety. (Patient not taking: Reported on 03/14/2022) 01/23/2023: Stopped daily use in November, last took a pill in November.  Still has some at home   No facility-administered encounter medications on file as of 01/23/2023.   Allergies  Allergen Reactions   Penicillins Anaphylaxis and Hives    Mouth becomes swollen, trouble swallowing   Savella [Milnacipran Hcl] Other (See Comments)    "anxiety, constipation"   Pamelor [Nortriptyline Hcl]    Topamax [Topiramate]     ROS: no fever, chills, URI symptoms.  Fibromyalgia and pain per HPI. +anxiety. She first reported thinking that her symptoms could be from coming off the lorazepam--that she had all the emotional symptoms initially, and now comes the pain. She denies chest pain, shortness of breath. Denies hematuria, dysuria, or other urinary complaints (other than flank pain, which has resolved). Worried about her kidneys. +anxiety No bleeding, bruising, rash    PHYSICAL EXAM:  BP 134/72   Pulse 68   Ht 5\' 3"  (1.6 m)   Wt 147 lb 3.2 oz (66.8 kg)   BMI 26.08 kg/m  Wt Readings from Last 3 Encounters:  01/23/23 147 lb 3.2 oz (66.8 kg)  10/13/22 144 lb 6.4 oz (65.5 kg)  08/04/22 143 lb 3.2 oz (65 kg)   Somewhat irritable female, appearing very frustrated regarding her severe pain (which lasted for 5 days, 2 weeks ago, with only slight recurrence, when pressed for details).  She expresses wanting to "know what's going on" with her body. HEENT: conjunctiva and sclera are clear, EOMI Neck: no lymphadenopathy, thyromegaly or mass Heart: regular rate and rhythm Lungs: clear bilaterally Abdomen: soft, nontender, no mass Extremities: no edema Back: No spinal tenderness.  No trigger points No muscle spasm Tender superficially over inferior ribs posteriorly  on the right No CVA tenderness on the left (can't say on the right, since tender superficially) Extremities: No pain with IT stretch Tender at troch bursa on L, and muscles above this area. FROM of hips without pain bilaterally    ASSESSMENT/PLAN:  Fibromyalgia - Prev didn't tolerate meds. Offered PT referal, rheum referral. Strongly encouraged CBT d/t her anxiety, and exaggeration/generalization about pain impacts  Right flank pain - superficial, over inferior ribs, MSK. Urine checked to r/o kidney issues, normal. - Plan: POCT Urinalysis DIP (Proadvantage Device)  Generalized anxiety disorder - no longer on benzos. Discussed therapy, specifically CBT to help with anxiety and response to pain/fibromyalgia  Left hip pain - component of trochanteric bursitis; pain is more widespread, to muscles above this area. Supportive measures reviewed (voltaren gel, heat/ice/stretch)  Can continue treatments that are working (muscle relaxants prn). Seems to have more  GI SE from NSAIDs--stick with tylenol and voltaren, use NSAIDs sparingly. Denies need for refills today.  Consider physical therapy for your fibromyalgia pain. I'm not sure if there are any therapists that specialize in this specifically ([facility] used to be my go-to, specifically because of their water therapy). [employer] often have a water class for fibromyalgia--look into this.  There often is no role for anti-inflammatories for the routine pain of fibromyalgia. It is fine to use muscle relaxants as needed, for muscle spasm/tightness, not pain. Topical therapies such as biofreeze, Salonpas with lidocaine, voltaren gel (this is anti-inflammatory, but TOPICAL, so won't bother kidneys or stomach).  I think that cognitive behavioral therapy can be very helpful for anxiety and chronic pain (specifically fibromyalgia). Check with your insurance to find a provider.    All recent labs reviewed with patient, all questions  answered.    Chemistry      Component Value Date/Time   NA 140 10/13/2022 0959   K 5.2 10/13/2022 0959   CL 103 10/13/2022 0959   CO2 24 10/13/2022 0959   BUN 8 10/13/2022 0959   CREATININE 0.86 10/13/2022 0959      Component Value Date/Time   CALCIUM 10.1 10/13/2022 0959   ALKPHOS 88 10/13/2022 0959   AST 19 10/13/2022 0959   ALT 15 10/13/2022 0959   BILITOT 0.5 10/13/2022 0959     Lab Results  Component Value Date   WBC 5.7 10/13/2022   HGB 15.1 10/13/2022   HCT 46.0 10/13/2022   MCV 90 10/13/2022   PLT 267 10/13/2022   Lab Results  Component Value Date   CHOL 228 (H) 10/13/2022   HDL 75 10/13/2022   LDLCALC 136 (H) 10/13/2022   TRIG 99 10/13/2022   CHOLHDL 3.0 10/13/2022   HepC nonreactive  I spent 55 minutes dedicated to the care of this patient, including pre-visit review of records, face to face time, post-visit ordering of testing and documentation.

## 2023-01-23 NOTE — Patient Instructions (Addendum)
Consider physical therapy for your fibromyalgia pain. I'm not sure if there are any therapists that specialize in this specifically ([facility] used to be my go-to, specifically because of their water therapy). [employer] often have a water class for fibromyalgia--look into this. Let me know, and I'm happy to refer you for physical therapy.  There often is no role for anti-inflammatories for the routine pain of fibromyalgia. It is fine to use muscle relaxants as needed, for muscle spasm/tightness, not pain. Topical therapies such as biofreeze, Salonpas with lidocaine, voltaren gel (this is anti-inflammatory, but TOPICAL, so won't bother kidneys or stomach).  I think that cognitive behavioral therapy can be very helpful for anxiety and chronic pain (specifically fibromyalgia). Check with your insurance to find a provider.

## 2023-03-09 ENCOUNTER — Ambulatory Visit: Payer: Medicare Other | Admitting: Family Medicine

## 2023-04-26 ENCOUNTER — Ambulatory Visit: Payer: Medicare Other | Admitting: Podiatry

## 2023-05-01 ENCOUNTER — Encounter: Payer: Self-pay | Admitting: Podiatry

## 2023-05-01 ENCOUNTER — Ambulatory Visit (INDEPENDENT_AMBULATORY_CARE_PROVIDER_SITE_OTHER): Payer: Medicare Other

## 2023-05-01 ENCOUNTER — Ambulatory Visit: Payer: Medicare Other | Admitting: Podiatry

## 2023-05-01 DIAGNOSIS — M722 Plantar fascial fibromatosis: Secondary | ICD-10-CM

## 2023-05-01 DIAGNOSIS — M7751 Other enthesopathy of right foot: Secondary | ICD-10-CM

## 2023-05-01 MED ORDER — TRIAMCINOLONE ACETONIDE 10 MG/ML IJ SUSP
10.0000 mg | Freq: Once | INTRAMUSCULAR | Status: AC
  Administered 2023-05-01: 10 mg via INTRA_ARTICULAR

## 2023-05-01 NOTE — Progress Notes (Signed)
 Subjective:   Patient ID: Brenda Smith, female   DOB: 73 y.o.   MRN: [id number]   HPI Patient states she is having a lot of pain on top of her foot and is concerned about Planter fasciitis which she has had in the past.  Patient states this has been ongoing and she does not remember injury been present for around a month and is very active does not smoke   Review of Systems  All other systems reviewed and are negative.       Objective:  Physical Exam Vitals and nursing note reviewed.  Constitutional:      Appearance: She is well-developed.  Pulmonary:     Effort: Pulmonary effort is normal.  Musculoskeletal:        General: Normal range of motion.  Skin:    General: Skin is warm.  Neurological:     Mental Status: She is alert.     Neurovascular status intact muscle strength found to be adequate range of motion within normal limits with patient noted to have inflammation pain of the second metatarsal phalangeal joint right with fluid buildup and mild discomfort into the arch and heel.  Good digital perfusion well-oriented x 3     Assessment:  Inflammatory capsulitis second MPJ right fluid buildup noted with possible mild plantar fasciitis     Plan:  H&P reviewed and reviewed x-rays.  Today sterile prep injected periarticular around the second MPJ 3 mg Dexasone Kenalog 5 mg Xylocaine and advised on anti-inflammatories rigid bottom shoes reappoint as symptoms indicate  X-rays were negative for signs of fracture or bony pathology around the second MPJ right foot with small plantar heel spur no signs of stress fracture

## 2023-05-24 ENCOUNTER — Encounter: Payer: Self-pay | Admitting: Family Medicine

## 2023-06-12 NOTE — Telephone Encounter (Signed)
 Called patient, she said she has always heard great thing about you and since she is getting older she wanted to try you.  I told her you said you would take her.

## 2023-07-10 ENCOUNTER — Telehealth: Payer: Self-pay | Admitting: Cardiology

## 2023-07-10 NOTE — Telephone Encounter (Signed)
 LM for the pt re: her request for a Ca Score... I wanted to see if she is feeling well prior to asking the MD.

## 2023-07-10 NOTE — Telephone Encounter (Signed)
 Pt called in stating she was told to f/u on a need basis but was also told to have CT calcium repeated in two years, please advise if this can be ordered.

## 2023-07-11 NOTE — Telephone Encounter (Signed)
 Pt was seen 03/15/21 by Hochrein: FAMILY HISTORY OF CAD: The patient has a strong family history of coronary artery disease.  I would like to screen her with a coronary calcium score.  I would actually have a low threshold for POET (Plain Old Exercise Treadmill)  given this result and her dyslipidemia.  Disposition:   FU with me based on the results of the above.     Excellent result.  Coronary calcium score 0.  Odds of serious cardiovascular events related to coronary disease in the next several years are very very low.   Returned call to patient and advised that we don't typically repeat these tests, especially in light of her score being so low. Asked that if she wants this done, her PCP can order it, or if wants us  to order, she can call us  back and I can further it with MD.

## 2023-07-24 ENCOUNTER — Encounter: Payer: Self-pay | Admitting: Podiatry

## 2023-07-24 ENCOUNTER — Ambulatory Visit: Admitting: Podiatry

## 2023-07-24 ENCOUNTER — Ambulatory Visit (INDEPENDENT_AMBULATORY_CARE_PROVIDER_SITE_OTHER)

## 2023-07-24 VITALS — Ht 63.0 in | Wt 147.0 lb

## 2023-07-24 DIAGNOSIS — S93492A Sprain of other ligament of left ankle, initial encounter: Secondary | ICD-10-CM | POA: Diagnosis not present

## 2023-07-24 DIAGNOSIS — M7732 Calcaneal spur, left foot: Secondary | ICD-10-CM

## 2023-08-09 ENCOUNTER — Encounter: Payer: Self-pay | Admitting: Family Medicine

## 2023-08-10 ENCOUNTER — Encounter: Payer: Self-pay | Admitting: *Deleted

## 2023-08-10 ENCOUNTER — Other Ambulatory Visit: Payer: Self-pay | Admitting: *Deleted

## 2023-08-10 DIAGNOSIS — M797 Fibromyalgia: Secondary | ICD-10-CM

## 2023-08-17 NOTE — Progress Notes (Signed)
 Subjective:   Patient ID: Brenda Smith, female   DOB: 73 y.o.   MRN: [id number]   HPI Patient presents stating she has had some discomfort in the left foot she has some swelling but overall the pain has receded.  The swelling is concerning for her   ROS      Objective:  Physical Exam  Neurovascular status intact negative Celine Collard' sign was noted range of motion adequate discomfort into the left heel and ankle localized with +1 pitting edema and discomfort around the ankle     Assessment:  Probability that we are dealing with some form of localized edematous process along with history of chronic fascial inflammation     Plan:  H&P reviewed and at this point I have recommended elevation and I dispensed ankle compression stocking and stretching exercises and shoe gear modifications.  Reappoint as symptoms indicate  X-rays were negative for fracture of the calcaneus spur noted no other pathology

## 2023-08-17 NOTE — Patient Instructions (Signed)
   We just got 3 point

## 2023-08-21 ENCOUNTER — Ambulatory Visit: Admitting: Podiatry

## 2023-08-28 ENCOUNTER — Ambulatory Visit: Admitting: Nurse Practitioner

## 2023-08-28 ENCOUNTER — Encounter: Payer: Self-pay | Admitting: Nurse Practitioner

## 2023-08-28 VITALS — BP 130/78 | HR 60 | Wt 144.0 lb

## 2023-08-28 DIAGNOSIS — F5101 Primary insomnia: Secondary | ICD-10-CM | POA: Diagnosis not present

## 2023-08-28 DIAGNOSIS — H401130 Primary open-angle glaucoma, bilateral, stage unspecified: Secondary | ICD-10-CM | POA: Insufficient documentation

## 2023-08-28 DIAGNOSIS — J4520 Mild intermittent asthma, uncomplicated: Secondary | ICD-10-CM | POA: Insufficient documentation

## 2023-08-28 DIAGNOSIS — E559 Vitamin D deficiency, unspecified: Secondary | ICD-10-CM | POA: Insufficient documentation

## 2023-08-28 DIAGNOSIS — G43109 Migraine with aura, not intractable, without status migrainosus: Secondary | ICD-10-CM | POA: Insufficient documentation

## 2023-08-28 DIAGNOSIS — G4700 Insomnia, unspecified: Secondary | ICD-10-CM | POA: Insufficient documentation

## 2023-08-28 DIAGNOSIS — J301 Allergic rhinitis due to pollen: Secondary | ICD-10-CM | POA: Insufficient documentation

## 2023-08-28 DIAGNOSIS — K5909 Other constipation: Secondary | ICD-10-CM

## 2023-08-28 DIAGNOSIS — G629 Polyneuropathy, unspecified: Secondary | ICD-10-CM | POA: Diagnosis not present

## 2023-08-28 DIAGNOSIS — R5382 Chronic fatigue, unspecified: Secondary | ICD-10-CM | POA: Insufficient documentation

## 2023-08-28 DIAGNOSIS — K580 Irritable bowel syndrome with diarrhea: Secondary | ICD-10-CM | POA: Insufficient documentation

## 2023-08-28 DIAGNOSIS — H409 Unspecified glaucoma: Secondary | ICD-10-CM | POA: Insufficient documentation

## 2023-08-28 DIAGNOSIS — M791 Myalgia, unspecified site: Secondary | ICD-10-CM

## 2023-08-28 DIAGNOSIS — M62838 Other muscle spasm: Secondary | ICD-10-CM

## 2023-08-28 DIAGNOSIS — K30 Functional dyspepsia: Secondary | ICD-10-CM | POA: Insufficient documentation

## 2023-08-28 DIAGNOSIS — H8109 Meniere's disease, unspecified ear: Secondary | ICD-10-CM | POA: Insufficient documentation

## 2023-08-28 MED ORDER — GABAPENTIN 300 MG PO CAPS: 300.0000 mg | ORAL_CAPSULE | Freq: Two times a day (BID) | ORAL | 3 refills | Status: DC

## 2023-08-28 MED ORDER — TRAZODONE HCL 50 MG PO TABS: 25.0000 mg | ORAL_TABLET | Freq: Every evening | ORAL | 3 refills | Status: DC | PRN

## 2023-08-28 NOTE — Patient Instructions (Signed)
 I will check with Dr. Mai (rheumatology) and Dr Atha (physical med) and see who we can get you in with.   I will let you know if any of the inflammatory/autoimmune labs are positive.  Let me know if the gabapentin  works for you.   Let me know if the sleep medication works for you.

## 2023-08-28 NOTE — Progress Notes (Signed)
 Camie FORBES Doing, DNP, AGNP-c [facility] [address] [place], [place] [address] [phone]   ACUTE VISIT- ESTABLISHED PATIENT  Blood pressure 130/78, pulse 60, weight 144 lb (65.3 kg).  Subjective:  HPI History of Present Illness Brenda Smith is a 73 year old female who presents with worsening muscle pain and gastrointestinal issues.  She experiences worsening muscle pain described as a burning sensation leading to spasms. The pain is widespread, affecting her hips, shoulders, and feet, and is likened to a migraine, taking two to three days to subside. Physical activities like walking at the health club exacerbate the pain, and she uses ice for relief. Nerve pain is alleviated by acupuncture and massage therapy, which she receives regularly. She has been prescribed tizanidine , meloxicam, and nerve pain medication in the past. Current medications include tizanidine  and cyclobenzaprine. She has discontinued meloxicam at the recommendation of GI.   She had a history of migraines, but as these ceased, the muscle pain started. The muscle pain is of similar intensity. She has tried various treatments, including drinking water, electrolytes, protein powder, and vitamins, and maintains an active lifestyle with regular workouts.  She has a family history of autoimmune conditions, as her mother had severe autoimmune dysfunction. She is concerned about whether her symptoms are related to fibromyalgia or another muscle-related issue. Many years ago she was told she may have fibromyalgia, but has not had any testing or other investigation.   She reports difficulty sleeping since discontinuing lorazepam  in 2023, averaging five to six hours of sleep per night. She experiences frequent awakenings and has difficulty returning to sleep. She has previously used lorazepam  but discontinued it due to concerns about dependency.  She is a retired Runner, broadcasting/film/video, engages in gardening, and enjoys  reading. She has no children or grandchildren to assist her. She reports chronic constipation and has used Linzess in the past, which was effective but too strong.  ROS negative except for what is listed in HPI. History, Medications, Surgery, SDOH, and Family History reviewed and updated as appropriate.  Objective:  Physical Exam Vitals and nursing note reviewed.  Constitutional:      General: She is not in acute distress.    Appearance: Normal appearance. She is normal weight.   Eyes:     Conjunctiva/sclera: Conjunctivae normal.    Cardiovascular:     Rate and Rhythm: Normal rate and regular rhythm.     Pulses: Normal pulses.     Heart sounds: Normal heart sounds.  Pulmonary:     Effort: Pulmonary effort is normal.     Breath sounds: Normal breath sounds.  Abdominal:     Palpations: Abdomen is soft.   Musculoskeletal:        General: Tenderness present. No swelling.     Right lower leg: No edema.     Left lower leg: No edema.     Comments: No pinpoint tenderness present. No nodularities in muscles.    Skin:    General: Skin is warm and dry.     Capillary Refill: Capillary refill takes less than 2 seconds.   Neurological:     Mental Status: She is alert and oriented to person, place, and time.     Motor: No weakness.   Psychiatric:        Mood and Affect: Mood normal.        Behavior: Behavior normal.         Assessment & Plan:   Problem List Items Addressed This Visit  Myalgia - Primary (Chronic)   Chronic generalized muscle pain and spasms, possibly related to fibromyalgia, but not following the typical pattern. Pain is widespread, affecting areas such as hips, shoulders, and feet. Pain exacerbated by dehydration, possibly medications like Colace and Miralax. Previous treatments include tizanidine , cyclobenzaprine, gabapentin , and meloxicam. Acupuncture and massage therapy provide some relief. Concerns about long-term lorazepam  use and exploring other causes  such as anxiety or stress. Rheumatology referral is pending. Would also like evaluation with physical medicine doctor specializing in chronic pain. Gabapentin  may be restarted with caution due to previous swelling. - Referral to rheumatology already placed for evaluation of possible autoimmune conditions. - Referral to a physical medicine doctor specializing in chronic pain if rheumatology is not conclusive or to aid in diagnosis.  - Order lab tests including sed rate, C-reactive protein, ANA, CK, and rheumatoid factor to evaluate for autoimmune conditions. - Restart gabapentin  with caution, monitoring for side effects such as swelling. - Consider restarting meloxicam if needed, with caution due to potential gastrointestinal side effects. - Discuss the use of turmeric for inflammation management.      Relevant Orders   Sedimentation Rate   C-reactive protein   Rheumatoid factor   ANA, IFA (with reflex)   Anti-CCP Ab, IgG + IgA (RDL)   Ambulatory referral to Physical Medicine Rehab   Insomnia   Difficulty sleeping since discontinuation of lorazepam , with current sleep limited to 5-6 hours per night. Experiences frequent awakenings and has tried various relaxation techniques without significant improvement. Trazodone is considered for its sedative effects, non-habit forming nature, and low risk of grogginess. - Prescribe trazodone at a low dose (25-50 mg) to aid sleep, with the option to adjust based on tolerance and effectiveness.      Relevant Medications   traZODone (DESYREL) 50 MG tablet   Other constipation   Chronic constipation with exacerbation of muscle pain when using laxatives like Colace and Miralax. Linzess was previously effective but caused significant gastrointestinal side effects upon reintroduction. Exploring other options due to cost and insurance coverage issues. Considering lower doses to minimize side effects. - Consider trying a lower dose of Linzess if available, to  minimize side effects. - Explore alternative medications like Trulance, considering insurance coverage and cost.      Other Visit Diagnoses       Muscle spasm       Relevant Orders   Sedimentation Rate   C-reactive protein   Rheumatoid factor   ANA, IFA (with reflex)   Anti-CCP Ab, IgG + IgA (RDL)   Ambulatory referral to Physical Medicine Rehab     Neuropathy       Relevant Medications   gabapentin  (NEURONTIN ) 300 MG capsule   Other Relevant Orders   Sedimentation Rate   C-reactive protein   Rheumatoid factor   ANA, IFA (with reflex)   Anti-CCP Ab, IgG + IgA (RDL)   Ambulatory referral to Physical Medicine Rehab         Camie FORBES Doing, DNP, AGNP-c  Time: 48 minutes, >50% spent counseling, care coordination, chart review, and documentation.

## 2023-08-28 NOTE — Assessment & Plan Note (Signed)
 Chronic generalized muscle pain and spasms, possibly related to fibromyalgia, but not following the typical pattern. Pain is widespread, affecting areas such as hips, shoulders, and feet. Pain exacerbated by dehydration, possibly medications like Colace and Miralax. Previous treatments include tizanidine , cyclobenzaprine, gabapentin , and meloxicam. Acupuncture and massage therapy provide some relief. Concerns about long-term lorazepam  use and exploring other causes such as anxiety or stress. Rheumatology referral is pending. Would also like evaluation with physical medicine doctor specializing in chronic pain. Gabapentin  may be restarted with caution due to previous swelling. - Referral to rheumatology already placed for evaluation of possible autoimmune conditions. - Referral to a physical medicine doctor specializing in chronic pain if rheumatology is not conclusive or to aid in diagnosis.  - Order lab tests including sed rate, C-reactive protein, ANA, CK, and rheumatoid factor to evaluate for autoimmune conditions. - Restart gabapentin  with caution, monitoring for side effects such as swelling. - Consider restarting meloxicam if needed, with caution due to potential gastrointestinal side effects. - Discuss the use of turmeric for inflammation management.

## 2023-08-28 NOTE — Assessment & Plan Note (Signed)
 Difficulty sleeping since discontinuation of lorazepam , with current sleep limited to 5-6 hours per night. Experiences frequent awakenings and has tried various relaxation techniques without significant improvement. Trazodone is considered for its sedative effects, non-habit forming nature, and low risk of grogginess. - Prescribe trazodone at a low dose (25-50 mg) to aid sleep, with the option to adjust based on tolerance and effectiveness.

## 2023-08-28 NOTE — Assessment & Plan Note (Signed)
 Chronic constipation with exacerbation of muscle pain when using laxatives like Colace and Miralax. Linzess was previously effective but caused significant gastrointestinal side effects upon reintroduction. Exploring other options due to cost and insurance coverage issues. Considering lower doses to minimize side effects. - Consider trying a lower dose of Linzess if available, to minimize side effects. - Explore alternative medications like Trulance, considering insurance coverage and cost.

## 2023-08-30 ENCOUNTER — Ambulatory Visit: Payer: Self-pay | Admitting: Nurse Practitioner

## 2023-09-01 ENCOUNTER — Encounter: Payer: Self-pay | Admitting: Physical Medicine and Rehabilitation

## 2023-09-01 LAB — C-REACTIVE PROTEIN: CRP: <1 mg/L (ref 0–10)

## 2023-09-01 LAB — SEDIMENTATION RATE: Sed Rate: 3 mm/h (ref 0–40)

## 2023-09-01 LAB — RHEUMATOID FACTOR: Rheumatoid fact SerPl-aCnc: 11 [IU]/mL (ref <14.0)

## 2023-09-01 LAB — ANTINUCLEAR ANTIBODIES, IFA

## 2023-09-01 LAB — ANTI-CCP AB, IGG + IGA (RDL): Anti-CCP Ab, IgG + IgA (RDL): <20 U (ref <20)

## 2023-09-14 ENCOUNTER — Encounter: Payer: Self-pay | Admitting: Nurse Practitioner

## 2023-09-14 MED ORDER — LORAZEPAM 0.5 MG PO TABS: 0.2500 mg | ORAL_TABLET | Freq: Every evening | ORAL | 0 refills | Status: DC | PRN

## 2023-09-20 ENCOUNTER — Other Ambulatory Visit: Payer: Self-pay | Admitting: Nurse Practitioner

## 2023-09-20 DIAGNOSIS — F5101 Primary insomnia: Secondary | ICD-10-CM

## 2023-09-26 ENCOUNTER — Encounter: Payer: Self-pay | Admitting: Nurse Practitioner

## 2023-09-26 ENCOUNTER — Other Ambulatory Visit: Payer: Self-pay | Admitting: Nurse Practitioner

## 2023-09-26 DIAGNOSIS — M79609 Pain in unspecified limb: Secondary | ICD-10-CM

## 2023-09-26 MED ORDER — GABAPENTIN 100 MG PO CAPS: 100.0000 mg | ORAL_CAPSULE | Freq: Two times a day (BID) | ORAL | 3 refills | Status: DC

## 2023-10-17 ENCOUNTER — Encounter: Payer: Self-pay | Admitting: Physical Medicine and Rehabilitation

## 2023-10-17 ENCOUNTER — Encounter: Attending: Physical Medicine and Rehabilitation | Admitting: Physical Medicine and Rehabilitation

## 2023-10-17 VITALS — BP 133/79 | HR 64 | Ht 63.0 in | Wt 144.2 lb

## 2023-10-17 DIAGNOSIS — L639 Alopecia areata, unspecified: Secondary | ICD-10-CM | POA: Diagnosis not present

## 2023-10-17 DIAGNOSIS — M791 Myalgia, unspecified site: Secondary | ICD-10-CM | POA: Insufficient documentation

## 2023-10-17 DIAGNOSIS — K589 Irritable bowel syndrome without diarrhea: Secondary | ICD-10-CM | POA: Insufficient documentation

## 2023-10-17 NOTE — Patient Instructions (Addendum)
 Foods that may reduce pain: 1) Ginger (especially studied for arthritis)- reduce leukotriene production to decrease inflammation 2) Blueberries- high in phytonutrients that decrease inflammation 3) Salmon- marine omega-3s reduce joint swelling and pain 4) Pumpkin seeds- reduce inflammation 5) dark chocolate- reduces inflammation 6) turmeric- reduces inflammation 7) tart cherries - reduce pain and stiffness 8) extra virgin olive oil - its compound olecanthal helps to block prostaglandins  9) chili peppers- can be eaten or applied topically via capsaicin 10) mint- helpful for headache, muscle aches, joint pain, and itching 11) garlic- reduces inflammation  Link to further information on diet for chronic pain: http://www.bray.com/   Avoid eggs, gluten, dairy, processed foods  Eat a lot of fruits and vegetables  Celery juice  Constipation:  -Provided list of following foods that help with constipation and highlighted a few: 1) prunes- contain high amounts of fiber.  2) apples- has a form of dietary fiber called pectin that accelerates stool movement and increases beneficial gut bacteria 3) pears- in addition to fiber, also high in fructose and sorbitol which have laxative effect 4) figs- contain an enzyme ficin which helps to speed colonic transit 5) kiwis- contain an enzyme actinidin that improves gut motility and reduces constipation 6) oranges- rich in pectin (like apples) 7) grapefruits- contain a flavanol naringenin which has a laxative effect 8) vegetables- rich in fiber and also great sources of folate, vitamin C, and K 9) artichoke- high in inulin, prebiotic great for the microbiome 10) chicory- increases stool frequency and softness (can be added to coffee) 11) rhubarb- laxative effect 12) sweet potato- high fiber 13) beans, peas, and lentils- contain both soluble and insoluble fiber 14) chia seeds-  improves intestinal health and gut flora 15) flaxseeds- laxative effect 16) whole grain rye bread- high in fiber 17) oat bran- high in soluble and insoluble fiber 18) kefir- softens stools -recommended to try at least one of these foods every day.  -drink 6-8 glasses of water per day -walk regularly, especially after meals.     Massage coconut oil with a few drops rosemary into hair

## 2023-10-17 NOTE — Progress Notes (Signed)
 Subjective:    Patient ID: Brenda Smith, female    DOB: Apr 13, 1950, 73 y.o.   MRN: [id number]  HPI Brenda Smith is a 73 year old woman who presents to establish care fro myalgia  1) Myalgia: -started a few years ago when she was playing tennis- the good burn started to get really bad -her muscles feel that they overheat -was told by a neurologist years ago that she may have fibromyalgia -she likes to get deep massage -her mom had RA  2) IBS -she is sensitive to miralax and other laxatives as they dehydrate her -she has followed with Dr. Kristie from GI   Pain Inventory Average Pain 3 Pain Right Now 5 My pain is intermittent, burning, and tingling  In the last 24 hours, has pain interfered with the following? General activity 5 Relation with others 5 Enjoyment of life 6 What TIME of day is your pain at its worst? night Sleep (in general) Poor  Pain is worse with: walking, bending, and some activites Pain improves with: heat/ice Relief from Meds: 5  walk without assistance ability to climb steps?  yes do you drive?  yes  retired  bowel control problems tingling spasms anxiety  Any changes since last visit?  no  Primary care Camie Doing NP    Family History  Problem Relation Age of Onset   Anxiety disorder Mother    Heart attack Mother 72       She had mutliple medical problems.  Might of died of an MI   Cancer Mother        breast, lung, bone   Breast cancer Mother        shortly after menopause   Lung cancer Mother        former smoker   Bone cancer Mother    Heart attack Father 42   Gout Father    Anxiety disorder Father    CAD Brother 61       Stents   Stroke Brother 63   Heart disease Brother    Headache Maternal Grandmother        migraines   Heart disease Maternal Grandmother    Diabetes Cousin 25       type 1   Social History   Socioeconomic History   Marital status: Married    Spouse name: Not on file   Number of children: 0    Years of education: PHD   Highest education level: Not on file  Occupational History   Not on file  Tobacco Use   Smoking status: Never   Smokeless tobacco: Never  Substance and Sexual Activity   Alcohol use: Yes    Comment: One or two glasses of alcohol few times a year   Drug use: No   Sexual activity: Not Currently    Partners: Male    Birth control/protection: Post-menopausal, None  Other Topics Concern   Not on file  Social History Narrative   Patient is married, no children. No pets   Patient is right handed.   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   Patient drinks decaffeinated tea. Loves chocolate. Sporadic caffeine.      From [place]      Updated 10/2022.   Social Drivers of Health   Financial Resource Strain: Low Risk  (10/13/2022)   Overall Financial Resource Strain (CARDIA)  Difficulty of Paying Living Expenses: Not hard at all  Food Insecurity: No Food Insecurity (10/13/2022)   Hunger Vital Sign    Worried About Running Out of Food in the Last Year: Never true    Ran Out of Food in the Last Year: Never true  Transportation Needs: No Transportation Needs (10/13/2022)   PRAPARE - Administrator, Civil Service (Medical): No    Lack of Transportation (Non-Medical): No  Physical Activity: Insufficiently Active (10/13/2022)   Exercise Vital Sign    Days of Exercise per Week: 3 days    Minutes of Exercise per Session: 40 min  Stress: Stress Concern Present (10/13/2022)   [employer] of Occupational Health - Occupational Stress Questionnaire    Feeling of Stress : Rather much  Social Connections: Socially Integrated (10/13/2022)   Social Connection and Isolation Panel    Frequency of Communication with Friends and Family: Three times a week    Frequency of Social Gatherings with Friends and Family: Three times a week    Attends  Religious Services: More than 4 times per year    Active Member of Clubs or Organizations: Yes    Attends Engineer, structural: More than 4 times per year    Marital Status: Married   Past Surgical History:  Procedure Laterality Date   MOHS SURGERY     Squamous cell   TENNIS ELBOW RELEASE/NIRSCHEL PROCEDURE Right 1999   TONSILLECTOMY  1955   Past Medical History:  Diagnosis Date   Adhesive capsulitis of left shoulder    s/p injection 02/2021; Dr. Addie   Allergy    Anxiety disorder    Asthma 1985   Bradycardia    Colon polyps    Dr. Kristie   Fibromyalgia 09/14/2016   dx'd Dr. Jenel 2017   GERD (gastroesophageal reflux disease)    Glaucoma    IBS (irritable bowel syndrome)    Dr. Kristie   Irritable bowel syndrome with diarrhea 08/28/2023   Low iron    Migraine headache    prev saw Dr. Malcom   Myalgia and myositis, unspecified    SCC (squamous cell carcinoma), face    BP 133/79   Pulse 64   Ht 5' 3 (1.6 m)   Wt 144 lb 3.2 oz (65.4 kg)   SpO2 96%   BMI 25.54 kg/m   Opioid Risk Score:   Fall Risk Score:  `1  Depression screen PHQ 2/9     10/17/2023    1:05 PM 10/13/2022    8:50 AM 12/20/2021   10:58 AM 09/13/2021   12:17 PM  Depression screen PHQ 2/9  Decreased Interest 0 0 0 0  Down, Depressed, Hopeless 0 0 0 0  PHQ - 2 Score 0 0 0 0  Altered sleeping 2 2 2    Tired, decreased energy 1 0 1   Change in appetite 0 0 1   Feeling bad or failure about yourself  0 0 0   Trouble concentrating 0 0 0   Moving slowly or fidgety/restless 0 0 0   Suicidal thoughts 0 0 0   PHQ-9 Score 3 2 4    Difficult doing work/chores Somewhat difficult Not difficult at all Somewhat difficult     Review of Systems  HENT:  Positive for ear discharge.   Musculoskeletal:  Positive for myalgias.       Spasms  Neurological:        Tingling  Psychiatric/Behavioral:  The patient is nervous/anxious.  All other systems reviewed and are negative.      Objective:   Physical  Exam  Gen: no distress, normal appearing HEENT: oral mucosa pink and moist, NCAT Cardio: Reg rate Chest: normal effort, normal rate of breathing Abd: soft, non-distended Ext: no edema Psych: pleasant, normal affect Skin: intact Neuro: Alert and oriented x3 MSK: multiple trigger point injections     Assessment & Plan:   1) Chronic Pain Syndrome secondary to fibromyalgia -avoid processed foods, gluten, dairy, eggs -encouraged going to the gym every day  -Discussed Qutenza as an option for neuropathic pain control. Discussed that this is a capsaicin patch, stronger than capsaicin cream. Discussed that it is currently approved for diabetic peripheral neuropathy and post-herpetic neuralgia, but that it has also shown benefit in treating other forms of neuropathy. Provided patient with link to site to learn more about the patch: https://www.clark.biz/. Discussed that the patch would be placed in office and benefits usually last 3 months. Discussed that unintended exposure to capsaicin can cause severe irritation of eyes, mucous membranes, respiratory tract, and skin, but that Qutenza is a local treatment and does not have the systemic side effects of other nerve medications. Discussed that there may be pain, itching, erythema, and decreased sensory function associated with the application of Qutenza. Side effects usually subside within 1 week. A cold pack of analgesic medications can help with these side effects. Blood pressure can also be increased due to pain associated with administration of the patch.   Discussed extracorporeal shockwave therapy as a modality for treatment. Discussed that the device looks and feels like a massage gun and I would move it over the area of pain for about 10 minutes. The device releases sound waves to the area of pain and helps to improve blood flow and circulation to improve the healing process. Discuss that this initially induces inflammation and can sometimes  cause short-term increase in pain. Discussed that we typically do three weekly treatments, but sometimes up to 6 if needed, and after 6 weeks long term benefits can sometimes be achieved. Discussed that this is an FDA approved device, but not covered by insurance and would cost $60 per session. Will scheduled patient for 6 consecutive appointments and can cancel latter three if benefits are achieved after first three sessions.    -continue acupuncture -continue cantaloupe, melons, cucummbers -continue massage -discussed trigger point injections/dry needling -encouraged eating a lot of fruits and vegetables -Discussed current symptoms of pain and history of pain.  -Discussed benefits of exercise in reducing pain. -Discussed following foods that may reduce pain: 1) Ginger (especially studied for arthritis)- reduce leukotriene production to decrease inflammation 2) Blueberries- high in phytonutrients that decrease inflammation 3) Salmon- marine omega-3s reduce joint swelling and pain 4) Pumpkin seeds- reduce inflammation 5) dark chocolate- reduces inflammation 6) turmeric- reduces inflammation 7) tart cherries - reduce pain and stiffness 8) extra virgin olive oil - its compound olecanthal helps to block prostaglandins  9) chili peppers- can be eaten or applied topically via capsaicin 10) mint- helpful for headache, muscle aches, joint pain, and itching 11) garlic- reduces inflammation 12) Green tea- reduces inflammation and oxidative stress, helps with weight loss, may reduce the risk of cancer, recommend Double Green Matcha [place] of Tea daily  Link to further information on diet for chronic pain: http://www.bray.com/   2) IBS-constipation/constipation/diarrhea: -recommended avoiding gluten, dairy, eggs, processed foods  3) Alopecia: -recommended applying rosemary oil

## 2023-10-18 ENCOUNTER — Other Ambulatory Visit: Payer: Self-pay | Admitting: Nurse Practitioner

## 2023-10-18 DIAGNOSIS — F5101 Primary insomnia: Secondary | ICD-10-CM

## 2023-10-25 ENCOUNTER — Ambulatory Visit: Payer: Medicare Other | Admitting: Family Medicine

## 2023-10-26 ENCOUNTER — Encounter: Payer: Self-pay | Admitting: Physical Medicine and Rehabilitation

## 2023-10-30 ENCOUNTER — Encounter: Payer: Self-pay | Admitting: Nurse Practitioner

## 2023-10-31 ENCOUNTER — Other Ambulatory Visit: Payer: Self-pay | Admitting: Nurse Practitioner

## 2023-10-31 DIAGNOSIS — F411 Generalized anxiety disorder: Secondary | ICD-10-CM

## 2023-10-31 MED ORDER — SERTRALINE HCL 50 MG PO TABS: ORAL_TABLET | ORAL | 3 refills | Status: DC

## 2023-11-01 ENCOUNTER — Encounter: Admitting: Physical Medicine and Rehabilitation

## 2023-11-01 DIAGNOSIS — M791 Myalgia, unspecified site: Secondary | ICD-10-CM | POA: Diagnosis not present

## 2023-11-01 NOTE — Progress Notes (Signed)
 Subjective:    Patient ID: Brenda Smith, female    DOB: [birth year], 73 y.o.   MRN: [id number]  HPI An audio/video tele-health visit is felt to be the most appropriate encounter for this patient at this time. This is a follow up tele-visit via phone. The patient is at home. MD is at office. Prior to scheduling this appointment, our staff discussed the limitations of evaluation and management by telemedicine and the availability of in-person appointments. The patient expressed understanding and agreed to proceed.   Mrs. Eisenberger is a 73 year old woman who presents to establish care fro myalgia  1) Myalgia: -started a few years ago when she was playing tennis- the good burn started to get really bad -her muscles feel that they overheat -was told by a neurologist years ago that she may have fibromyalgia -she likes to get deep massage -her mom had RA -she would like to know the root cause rather than treating the symptoms -she has been eating a lot more fruits and vegetables  2) IBS -she is sensitive to miralax and other laxatives as they dehydrate her -she has followed with Dr. Kristie from GI   Pain Inventory Average Pain 3 Pain Right Now 5 My pain is intermittent, burning, and tingling  In the last 24 hours, has pain interfered with the following? General activity 5 Relation with others 5 Enjoyment of life 6 What TIME of day is your pain at its worst? night Sleep (in general) Poor  Pain is worse with: walking, bending, and some activites Pain improves with: heat/ice Relief from Meds: 5  walk without assistance ability to climb steps?  yes do you drive?  yes  retired  bowel control problems tingling spasms anxiety  Any changes since last visit?  no  Primary care Camie Doing NP    Family History  Problem Relation Age of Onset   Anxiety disorder Mother    Heart attack Mother 27       She had mutliple medical problems.  Might of died of an MI   Cancer Mother         breast, lung, bone   Breast cancer Mother        shortly after menopause   Lung cancer Mother        former smoker   Bone cancer Mother    Heart attack Father 53   Gout Father    Anxiety disorder Father    CAD Brother 13       Stents   Stroke Brother 8   Heart disease Brother    Headache Maternal Grandmother        migraines   Heart disease Maternal Grandmother    Diabetes Cousin 25       type 1   Social History   Socioeconomic History   Marital status: Married    Spouse name: Not on file   Number of children: 0   Years of education: PHD   Highest education level: Not on file  Occupational History   Not on file  Tobacco Use   Smoking status: Never   Smokeless tobacco: Never  Substance and Sexual Activity   Alcohol use: Yes    Comment: One or two glasses of alcohol few times a year   Drug use: No   Sexual activity: Not Currently    Partners: Male    Birth control/protection: Post-menopausal, None  Other Topics Concern   Not on file  Social History Narrative  Patient is married, no children. No pets   Patient is right handed.   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   Patient drinks decaffeinated tea. Loves chocolate. Sporadic caffeine.      From [place]      Updated 10/2022.   Social Drivers of Corporate investment banker Strain: Low Risk  (10/13/2022)   Overall Financial Resource Strain (CARDIA)    Difficulty of Paying Living Expenses: Not hard at all  Food Insecurity: No Food Insecurity (10/13/2022)   Hunger Vital Sign    Worried About Running Out of Food in the Last Year: Never true    Ran Out of Food in the Last Year: Never true  Transportation Needs: No Transportation Needs (10/13/2022)   PRAPARE - Administrator, Civil Service (Medical): No    Lack of Transportation (Non-Medical): No  Physical Activity: Insufficiently  Active (10/13/2022)   Exercise Vital Sign    Days of Exercise per Week: 3 days    Minutes of Exercise per Session: 40 min  Stress: Stress Concern Present (10/13/2022)   [employer] of Occupational Health - Occupational Stress Questionnaire    Feeling of Stress : Rather much  Social Connections: Socially Integrated (10/13/2022)   Social Connection and Isolation Panel    Frequency of Communication with Friends and Family: Three times a week    Frequency of Social Gatherings with Friends and Family: Three times a week    Attends Religious Services: More than 4 times per year    Active Member of Clubs or Organizations: Yes    Attends Engineer, structural: More than 4 times per year    Marital Status: Married   Past Surgical History:  Procedure Laterality Date   MOHS SURGERY     Squamous cell   TENNIS ELBOW RELEASE/NIRSCHEL PROCEDURE Right 1999   TONSILLECTOMY  1955   Past Medical History:  Diagnosis Date   Adhesive capsulitis of left shoulder    s/p injection 02/2021; Dr. Addie   Allergy    Anxiety disorder    Asthma 1985   Bradycardia    Colon polyps    Dr. Kristie   Fibromyalgia 09/14/2016   dx'd Dr. Jenel 2017   GERD (gastroesophageal reflux disease)    Glaucoma    IBS (irritable bowel syndrome)    Dr. Kristie   Irritable bowel syndrome with diarrhea 08/28/2023   Low iron    Migraine headache    prev saw Dr. Malcom   Myalgia and myositis, unspecified    SCC (squamous cell carcinoma), face    There were no vitals taken for this visit.  Opioid Risk Score:   Fall Risk Score:  `1  Depression screen PHQ 2/9     10/17/2023    1:05 PM 10/13/2022    8:50 AM 12/20/2021   10:58 AM 09/13/2021   12:17 PM  Depression screen PHQ 2/9  Decreased Interest 0 0 0 0  Down, Depressed, Hopeless 0 0 0 0  PHQ - 2 Score 0 0 0 0  Altered sleeping 2 2 2    Tired, decreased energy 1 0 1   Change in appetite 0 0 1   Feeling bad or failure about yourself  0 0 0   Trouble  concentrating 0 0 0   Moving slowly or fidgety/restless 0 0 0   Suicidal thoughts 0 0 0  PHQ-9 Score 3 2 4    Difficult doing work/chores Somewhat difficult Not difficult at all Somewhat difficult     Review of Systems  HENT:  Positive for ear discharge.   Musculoskeletal:  Positive for myalgias.       Spasms  Neurological:        Tingling  Psychiatric/Behavioral:  The patient is nervous/anxious.   All other systems reviewed and are negative.      Objective:   Physical Exam PRIOR EXAM: Gen: no distress, normal appearing HEENT: oral mucosa pink and moist, NCAT Cardio: Reg rate Chest: normal effort, normal rate of breathing Abd: soft, non-distended Ext: no edema Psych: pleasant, normal affect Skin: intact Neuro: Alert and oriented x3 MSK: multiple trigger point injections     Assessment & Plan:   1) Chronic Pain Syndrome secondary to fibromyalgia -avoid processed foods, gluten, dairy, eggs -encouraged going to the gym every day  -discussed her continued over-heating of muscles after exercise, discussed the potential benefits of balneotherapy, recommended trying at [facility] in [place]  -Discussed Qutenza as an option for neuropathic pain control. Discussed that this is a capsaicin patch, stronger than capsaicin cream. Discussed that it is currently approved for diabetic peripheral neuropathy and post-herpetic neuralgia, but that it has also shown benefit in treating other forms of neuropathy. Provided patient with link to site to learn more about the patch: https://www.clark.biz/. Discussed that the patch would be placed in office and benefits usually last 3 months. Discussed that unintended exposure to capsaicin can cause severe irritation of eyes, mucous membranes, respiratory tract, and skin, but that Qutenza is a local treatment and does not have the systemic side effects of other nerve medications. Discussed that there may be pain, itching, erythema, and decreased  sensory function associated with the application of Qutenza. Side effects usually subside within 1 week. A cold pack of analgesic medications can help with these side effects. Blood pressure can also be increased due to pain associated with administration of the patch.   Discussed extracorporeal shockwave therapy as a modality for treatment. Discussed that the device looks and feels like a massage gun and I would move it over the area of pain for about 10 minutes. The device releases sound waves to the area of pain and helps to improve blood flow and circulation to improve the healing process. Discuss that this initially induces inflammation and can sometimes cause short-term increase in pain. Discussed that we typically do three weekly treatments, but sometimes up to 6 if needed, and after 6 weeks long term benefits can sometimes be achieved. Discussed that this is an FDA approved device, but not covered by insurance and would cost $60 per session. Will scheduled patient for 6 consecutive appointments and can cancel latter three if benefits are achieved after first three sessions.    -continue acupuncture -continue cantaloupe, melons, cucummbers -continue massage -discussed trigger point injections/dry needling -encouraged eating a lot of fruits and vegetables -Discussed current symptoms of pain and history of pain.  -Discussed benefits of exercise in reducing pain. -Discussed following foods that may reduce pain: 1) Ginger (especially studied for arthritis)- reduce leukotriene production to decrease inflammation 2) Blueberries- high in phytonutrients that decrease inflammation 3) Salmon- marine omega-3s reduce joint swelling and pain 4) Pumpkin seeds- reduce inflammation 5) dark chocolate- reduces inflammation 6) turmeric- reduces inflammation 7) tart cherries - reduce pain and stiffness 8) extra virgin olive oil - its compound olecanthal helps to block prostaglandins  9) chili peppers- can be  eaten or applied topically via capsaicin 10) mint- helpful for headache, muscle aches, joint pain, and itching 11) garlic- reduces inflammation 12) Green tea- reduces inflammation and oxidative stress, helps with weight loss, may reduce the risk of cancer, recommend Double [employer] of Tea daily  Link to further information on diet for chronic pain: http://[email or web address]   2) IBS-constipation/constipation/diarrhea: -recommended avoiding gluten, dairy, eggs, processed foods  3) Alopecia: -recommended applying rosemary oil   5 minutes spent in discussion of her continued over-heating of muscles after exercise, discussed the potential benefits of balneotherapy, recommended trying at [facility] in [place]

## 2023-11-09 ENCOUNTER — Encounter: Admitting: Physical Medicine and Rehabilitation

## 2023-11-23 ENCOUNTER — Other Ambulatory Visit: Payer: Self-pay | Admitting: Family Medicine

## 2023-11-23 NOTE — Telephone Encounter (Signed)
 Is this okay to refill?

## 2023-11-24 ENCOUNTER — Other Ambulatory Visit: Payer: Self-pay | Admitting: Nurse Practitioner

## 2023-11-24 DIAGNOSIS — F411 Generalized anxiety disorder: Secondary | ICD-10-CM

## 2023-11-27 ENCOUNTER — Encounter: Payer: Self-pay | Admitting: Nurse Practitioner

## 2023-11-30 ENCOUNTER — Encounter: Payer: Self-pay | Admitting: Nurse Practitioner

## 2023-12-05 ENCOUNTER — Telehealth: Admitting: Nurse Practitioner

## 2023-12-06 ENCOUNTER — Other Ambulatory Visit: Payer: Self-pay | Admitting: Nurse Practitioner

## 2023-12-12 ENCOUNTER — Ambulatory Visit: Admitting: Physical Medicine and Rehabilitation

## 2023-12-29 ENCOUNTER — Ambulatory Visit (INDEPENDENT_AMBULATORY_CARE_PROVIDER_SITE_OTHER)

## 2023-12-29 DIAGNOSIS — Z23 Encounter for immunization: Secondary | ICD-10-CM

## 2024-01-16 ENCOUNTER — Ambulatory Visit (INDEPENDENT_AMBULATORY_CARE_PROVIDER_SITE_OTHER): Payer: Self-pay

## 2024-01-16 VITALS — BP 120/70 | HR 63 | Temp 97.6°F | Ht 63.0 in | Wt 140.6 lb

## 2024-01-16 DIAGNOSIS — Z0000 Encounter for general adult medical examination without abnormal findings: Secondary | ICD-10-CM | POA: Diagnosis not present

## 2024-01-16 NOTE — Progress Notes (Signed)
 Subjective:   Brenda Smith is a 73 y.o. female who presents for a Medicare Annual Wellness Visit.  Allergies (verified) Penicillins, Azithromycin, Erythromycin, Fluticasone furoate-vilanterol, Savella  [milnacipran  hcl], Pamelor [nortriptyline hcl], and Topamax [topiramate]   History: Past Medical History:  Diagnosis Date   Adhesive capsulitis of left shoulder    s/p injection 02/2021; Dr. Addie   Allergy    Anxiety disorder    Asthma 1985   Bradycardia    Colon polyps    Dr. Kristie   Fibromyalgia 09/14/2016   dx'd Dr. Jenel 2017   GERD (gastroesophageal reflux disease)    Glaucoma    IBS (irritable bowel syndrome)    Dr. Kristie   Irritable bowel syndrome with diarrhea 08/28/2023   Low iron    Migraine headache    prev saw Dr. Malcom   Myalgia and myositis, unspecified    SCC (squamous cell carcinoma), face    Past Surgical History:  Procedure Laterality Date   MOHS SURGERY     Squamous cell   TENNIS ELBOW RELEASE/NIRSCHEL PROCEDURE Right 1999   TONSILLECTOMY  1955   Family History  Problem Relation Age of Onset   Anxiety disorder Mother    Heart attack Mother 42       She had mutliple medical problems.  Might of died of an MI   Cancer Mother        breast, lung, bone   Breast cancer Mother        shortly after menopause   Lung cancer Mother        former smoker   Bone cancer Mother    Heart attack Father 65   Gout Father    Anxiety disorder Father    CAD Brother 77       Stents   Stroke Brother 21   Heart disease Brother    Headache Maternal Grandmother        migraines   Heart disease Maternal Grandmother    Diabetes Cousin 25       type 1   Social History   Occupational History   Not on file  Tobacco Use   Smoking status: Never   Smokeless tobacco: Never  Vaping Use   Vaping status: Never Used  Substance and Sexual Activity   Alcohol use: Not Currently    Comment: One or two glasses of alcohol few times a year   Drug use: No   Sexual  activity: Not Currently    Partners: Male    Birth control/protection: Post-menopausal, None   Tobacco Counseling Counseling given: Not Answered  SDOH Screenings   Food Insecurity: No Food Insecurity (01/13/2024)  Housing: Unknown (01/13/2024)  Transportation Needs: No Transportation Needs (01/13/2024)  Utilities: Not At Risk (01/16/2024)  Alcohol Screen: Low Risk  (01/13/2024)  Depression (PHQ2-9): Low Risk  (01/16/2024)  Financial Resource Strain: Low Risk  (01/13/2024)  Physical Activity: Insufficiently Active (01/13/2024)  Social Connections: Socially Integrated (01/13/2024)  Stress: Stress Concern Present (01/13/2024)  Tobacco Use: Low Risk  (01/16/2024)  Health Literacy: Adequate Health Literacy (01/16/2024)   Depression Screen    01/16/2024    2:35 PM 10/17/2023    1:05 PM 10/13/2022    8:50 AM 12/20/2021   10:58 AM 09/13/2021   12:17 PM  PHQ 2/9 Scores  PHQ - 2 Score 0 0 0 0 0  PHQ- 9 Score 2 3  2  4        Data saved with a previous flowsheet row definition  Goals Addressed             This Visit's Progress    Patient Stated       01/16/2024 denies goals at this time       Visit info / Clinical Intake: Medicare Wellness Visit Type:: Subsequent Annual Wellness Visit Persons participating in visit:: patient Medicare Wellness Visit Mode:: In-person (required for WTM) Information given by:: patient Interpreter Needed?: No Pre-visit prep was completed: yes AWV questionnaire completed by patient prior to visit?: yes Date:: 01/13/24 Living arrangements:: lives with spouse/significant other Patient's Overall Health Status Rating: good Typical amount of pain: some Does pain affect daily life?: no Are you currently prescribed opioids?: no  Dietary Habits and Nutritional Risks How many meals a day?: 3 Eats fruit and vegetables daily?: yes Most meals are obtained by: preparing own meals Diabetic:: no  Functional Status Activities of Daily Living (to include  ambulation/medication): Independent Ambulation: Independent Medication Administration: Independent Home Management: Independent Manage your own finances?: yes Primary transportation is: driving Concerns about vision?: no *vision screening is required for WTM* Concerns about hearing?: no  Fall Screening Falls in the past year?: 1 (twisted ankle) Number of falls in past year: 0 Was there an injury with Fall?: 1 (reinjured ankle) Fall Risk Category Calculator: 2 Patient Fall Risk Level: Moderate Fall Risk  Fall Risk Patient at Risk for Falls Due to: Medication side effect Fall risk Follow up: Falls prevention discussed; Falls evaluation completed  Home and Transportation Safety: All rugs have non-skid backing?: yes All stairs or steps have railings?: (!) no Grab bars in the bathtub or shower?: yes Have non-skid surface in bathtub or shower?: yes Good home lighting?: yes Regular seat belt use?: yes Hospital stays in the last year:: no  Cognitive Assessment Difficulty concentrating, remembering, or making decisions? : no Will 6CIT or Mini Cog be Completed: yes What year is it?: 0 points What month is it?: 0 points Give patient an address phrase to remember (5 components): [address] About what time is it?: 0 points Count backwards from 20 to 1: 0 points Say the months of the year in reverse: 0 points Repeat the address phrase from earlier: 4 points 6 CIT Score: 4 points  Advance Directives (For Healthcare) Does Patient Have a Medical Advance Directive?: Yes Type of Advance Directive: Out of facility DNR (pink MOST or yellow form) Out of facility DNR (pink MOST or yellow form) in Chart? (Ambulatory ONLY): Yes - validated most recent copy scanned in chart  Reviewed/Updated  Reviewed/Updated: Reviewed All (Medical, Surgical, Family, Medications, Allergies, Care Teams, Patient Goals)        Objective:    Today's Vitals   01/16/24 1433  BP: 120/70  Pulse: 63   Temp: 97.6 F (36.4 C)  TempSrc: Oral  SpO2: 96%  Weight: 140 lb 9.6 oz (63.8 kg)  Height: 5' 3 (1.6 m)   Body mass index is 24.91 kg/m.  Current Medications (verified) Outpatient Encounter Medications as of 01/16/2024  Medication Sig   Biotin (BIOTIN 5000) 5 MG CAPS Take 1 capsule by mouth daily.   brimonidine (ALPHAGAN P) 0.1 % SOLN Place 1 drop into both eyes 2 (two) times daily.   Cholecalciferol (VITAMIN D3 PO) Take 3,000 Units by mouth.   Coenzyme Q10 (CO Q 10 PO) Take by mouth.   diphenhydrAMINE (BENADRYL) 12.5 MG/5ML elixir Take 12.5 mg by mouth 4 (four) times daily as needed.   Evening Primrose Oil 500 MG CAPS Take  1 capsule by mouth daily.   famotidine (PEPCID) 40 MG tablet    fluorouracil (EFUDEX) 5 % cream 1 APPLICATION EXTERNALLY DAILY   hydrOXYzine (VISTARIL) 25 MG capsule    linaclotide (LINZESS) 72 MCG capsule 1 cap(s) orally 20 minutes before breakfast; Duration: 90 days   loratadine (CLARITIN) 10 MG tablet Take 10 mg by mouth.   Omega-3 350 MG CPDR Take 2 capsules by mouth daily.   PROAIR HFA 108 (90 Base) MCG/ACT inhaler    pyridoxine (B-6) 100 MG tablet Take 100 mg by mouth daily.   tiZANidine  (ZANAFLEX ) 2 MG tablet Take 1 tablet (2 mg total) by mouth every 12 (twelve) hours as needed for muscle spasms. Do not take with other muscle relaxer or sedating medication.   vitamin B-12 (CYANOCOBALAMIN) 500 MCG tablet Take 500 mcg by mouth daily.   vitamin C (ASCORBIC ACID) 500 MG tablet Take 500 mg by mouth daily.   sucralfate (CARAFATE) 1 g tablet Take 1 g by mouth 4 (four) times daily -  with meals and at bedtime. (Patient not taking: Reported on 01/16/2024)   traZODone  (DESYREL ) 50 MG tablet TAKE 0.5-1 TABLETS BY MOUTH AT BEDTIME AS NEEDED FOR SLEEP. (Patient not taking: Reported on 01/16/2024)   No facility-administered encounter medications on file as of 01/16/2024.   Hearing/Vision screen Hearing Screening - Comments:: Has ringing in ears  Vision Screening  - Comments:: Regular eye exams, Dr. Patrcia Immunizations and Health Maintenance Health Maintenance  Topic Date Due   Zoster Vaccines- Shingrix (1 of 2) 10/18/1968   Mammogram  12/25/2022   COVID-19 Vaccine (5 - 2025-26 season) 11/06/2023   DTaP/Tdap/Td (3 - Td or Tdap) 11/23/2024   Medicare Annual Wellness (AWV)  01/15/2025   Colonoscopy  08/13/2025   Pneumococcal Vaccine: 50+ Years  Completed   Influenza Vaccine  Completed   DEXA SCAN  Completed   Hepatitis C Screening  Completed   Meningococcal B Vaccine  Aged Out        Assessment/Plan:  This is a routine wellness examination for Brenda Smith.  Patient Care Team: Early, Camie BRAVO, NP as PCP - General (Nurse Practitioner) Lavona Agent, MD as PCP - Cardiology (Cardiology) Mat Browning, MD as Consulting Physician (Obstetrics and Gynecology) Patrcia Sharper, MD as Consulting Physician (Ophthalmology) Robinson Pao, MD as Consulting Physician (Dermatology) Kristie Lamprey, MD as Consulting Physician (Gastroenterology)  I have personally reviewed and noted the following in the patient's chart:   Medical and social history Use of alcohol, tobacco or illicit drugs  Current medications and supplements including opioid prescriptions. Functional ability and status Nutritional status Physical activity Advanced directives List of other physicians Hospitalizations, surgeries, and ER visits in previous 12 months Vitals Screenings to include cognitive, depression, and falls Referrals and appointments  No orders of the defined types were placed in this encounter.  In addition, I have reviewed and discussed with patient certain preventive protocols, quality metrics, and best practice recommendations. A written personalized care plan for preventive services as well as general preventive health recommendations were provided to patient.   Brenda BRAVO Dawn, LPN   [id number]/88/7974   Return in 1 year (on 01/15/2025).  After Visit Summary: (In  Person-Declined) Patient declined AVS at this time.  Nurse Notes:  Mammogram scheduled for 04/09/2024. Due for shingles vaccine and declines covid vaccine.

## 2024-01-16 NOTE — Patient Instructions (Signed)
 Brenda Smith,  Thank you for taking the time for your Medicare Wellness Visit. I appreciate your continued commitment to your health goals. Please review the care plan we discussed, and feel free to reach out if I can assist you further.  Please note that Annual Wellness Visits do not include a physical exam. Some assessments may be limited, especially if the visit was conducted virtually. If needed, we may recommend an in-person follow-up with your provider.  Ongoing Care Seeing your primary care provider every 3 to 6 months helps us  monitor your health and provide consistent, personalized care.   Referrals If a referral was made during today's visit and you haven't received any updates within two weeks, please contact the referred provider directly to check on the status.  Recommended Screenings:  Health Maintenance  Topic Date Due   Zoster (Shingles) Vaccine (1 of 2) 10/18/1968   Breast Cancer Screening  12/25/2022   Medicare Annual Wellness Visit  10/13/2023   COVID-19 Vaccine (5 - 2025-26 season) 11/06/2023   DTaP/Tdap/Td vaccine (3 - Td or Tdap) 11/23/2024   Colon Cancer Screening  08/13/2025   Pneumococcal Vaccine for age over 37  Completed   Flu Shot  Completed   DEXA scan (bone density measurement)  Completed   Hepatitis C Screening  Completed   Meningitis B Vaccine  Aged Out       01/16/2024    2:43 PM  Advanced Directives  Does Patient Have a Medical Advance Directive? Yes  Type of Advance Directive Out of facility DNR (pink MOST or yellow form)    Vision: Annual vision screenings are recommended for early detection of glaucoma, cataracts, and diabetic retinopathy. These exams can also reveal signs of chronic conditions such as diabetes and high blood pressure.  Dental: Annual dental screenings help detect early signs of oral cancer, gum disease, and other conditions linked to overall health, including heart disease and diabetes.  Please see the attached documents  for additional preventive care recommendations.

## 2024-01-18 NOTE — Progress Notes (Unsigned)
 Subjective:    Patient ID: Brenda Smith, female    DOB: [birth year], 73 y.o.   MRN: [id number]  HPI An audio/video tele-health visit is felt to be the most appropriate encounter for this patient at this time. This is a follow up tele-visit via phone. The patient is at home. MD is at office. Prior to scheduling this appointment, our staff discussed the limitations of evaluation and management by telemedicine and the availability of in-person appointments. The patient expressed understanding and agreed to proceed.   Brenda Smith is a 73 year old woman who presents to establish care fro myalgia  1) Myalgia: -started a few years ago when she was playing tennis- the good burn started to get really bad -her muscles feel that they overheat -was told by a neurologist years ago that she may have fibromyalgia -she likes to get deep massage -her mom had RA -she would like to know the root cause rather than treating the symptoms -she has been eating a lot more fruits and vegetables  2) IBS -she is sensitive to miralax and other laxatives as they dehydrate her -she has followed with Dr. Kristie from GI   Pain Inventory Average Pain 3 Pain Right Now 5 My pain is intermittent, burning, and tingling  In the last 24 hours, has pain interfered with the following? General activity 5 Relation with others 5 Enjoyment of life 6 What TIME of day is your pain at its worst? night Sleep (in general) Poor  Pain is worse with: walking, bending, and some activites Pain improves with: heat/ice Relief from Meds: 5  walk without assistance ability to climb steps?  yes do you drive?  yes  retired  bowel control problems tingling spasms anxiety  Any changes since last visit?  no  Primary care Camie Doing NP    Family History  Problem Relation Age of Onset   Anxiety disorder Mother    Heart attack Mother 3       She had mutliple medical problems.  Might of died of an MI   Cancer Mother         breast, lung, bone   Breast cancer Mother        shortly after menopause   Lung cancer Mother        former smoker   Bone cancer Mother    Heart attack Father 39   Gout Father    Anxiety disorder Father    CAD Brother 39       Stents   Stroke Brother 41   Heart disease Brother    Headache Maternal Grandmother        migraines   Heart disease Maternal Grandmother    Diabetes Cousin 25       type 1   Social History   Socioeconomic History   Marital status: Married    Spouse name: Not on file   Number of children: 0   Years of education: PHD   Highest education level: Doctorate  Occupational History   Not on file  Tobacco Use   Smoking status: Never   Smokeless tobacco: Never  Vaping Use   Vaping status: Never Used  Substance and Sexual Activity   Alcohol use: Not Currently    Comment: One or two glasses of alcohol few times a year   Drug use: No   Sexual activity: Not Currently    Partners: Male    Birth control/protection: Post-menopausal, None  Other Topics Concern   Not  on file  Social History Narrative   Patient is married, no children. No pets   Patient is right handed.   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   Patient drinks decaffeinated tea. Loves chocolate. Sporadic caffeine.      From [place]      Updated 10/2022.   Social Drivers of Corporate Investment Banker Strain: Low Risk  (01/13/2024)   Overall Financial Resource Strain (CARDIA)    Difficulty of Paying Living Expenses: Not hard at all  Food Insecurity: No Food Insecurity (01/13/2024)   Hunger Vital Sign    Worried About Running Out of Food in the Last Year: Never true    Ran Out of Food in the Last Year: Never true  Transportation Needs: No Transportation Needs (01/13/2024)   PRAPARE - Administrator, Civil Service (Medical): No    Lack of Transportation  (Non-Medical): No  Physical Activity: Insufficiently Active (01/13/2024)   Exercise Vital Sign    Days of Exercise per Week: 4 days    Minutes of Exercise per Session: 30 min  Stress: Stress Concern Present (01/13/2024)   [employer] of Occupational Health - Occupational Stress Questionnaire    Feeling of Stress: Very much  Social Connections: Socially Integrated (01/13/2024)   Social Connection and Isolation Panel    Frequency of Communication with Friends and Family: Three times a week    Frequency of Social Gatherings with Friends and Family: Twice a week    Attends Religious Services: More than 4 times per year    Active Member of [employer] or Organizations: Yes    Attends Engineer, Structural: More than 4 times per year    Marital Status: Married   Past Surgical History:  Procedure Laterality Date   MOHS SURGERY     Squamous cell   TENNIS ELBOW RELEASE/NIRSCHEL PROCEDURE Right 1999   TONSILLECTOMY  1955   Past Medical History:  Diagnosis Date   Adhesive capsulitis of left shoulder    s/p injection 02/2021; Dr. Addie   Allergy    Anxiety disorder    Asthma 1985   Bradycardia    Colon polyps    Dr. Kristie   Fibromyalgia 09/14/2016   dx'd Dr. Jenel 2017   GERD (gastroesophageal reflux disease)    Glaucoma    IBS (irritable bowel syndrome)    Dr. Kristie   Irritable bowel syndrome with diarrhea 08/28/2023   Low iron    Migraine headache    prev saw Dr. Malcom   Myalgia and myositis, unspecified    SCC (squamous cell carcinoma), face    There were no vitals taken for this visit.  Opioid Risk Score:   Fall Risk Score:  `1  Depression screen [facility] 2/9     01/16/2024    2:35 PM 10/17/2023    1:05 PM 10/13/2022    8:50 AM 12/20/2021   10:58 AM 09/13/2021   12:17 PM  Depression screen PHQ 2/9  Decreased Interest 0 0 0 0 0  Down, Depressed, Hopeless 0 0 0 0 0  PHQ - 2 Score 0 0 0 0 0  Altered sleeping 2 2 2 2    Tired, decreased energy 0 1 0 1   Change in  appetite 0 0 0 1   Feeling bad or failure about yourself  0 0 0 0   Trouble concentrating  0 0 0 0   Moving slowly or fidgety/restless 0 0 0 0   Suicidal thoughts 0 0 0 0   PHQ-9 Score 2 3  2  4     Difficult doing work/chores Not difficult at all Somewhat difficult Not difficult at all Somewhat difficult      Data saved with a previous flowsheet row definition    Review of Systems  HENT:  Positive for ear discharge.   Musculoskeletal:  Positive for myalgias.       Spasms  Neurological:        Tingling  Psychiatric/Behavioral:  The patient is nervous/anxious.   All other systems reviewed and are negative.      Objective:   Physical Exam PRIOR EXAM: Gen: no distress, normal appearing HEENT: oral mucosa pink and moist, NCAT Cardio: Reg rate Chest: normal effort, normal rate of breathing Abd: soft, non-distended Ext: no edema Psych: pleasant, normal affect Skin: intact Neuro: Alert and oriented x3 MSK: multiple trigger point injections     Assessment & Plan:   1) Chronic Pain Syndrome secondary to fibromyalgia -avoid processed foods, gluten, dairy, eggs -encouraged going to the gym every day  -discussed her continued over-heating of muscles after exercise, discussed the potential benefits of balneotherapy, recommended trying at [facility] in [place]  -Discussed Qutenza as an option for neuropathic pain control. Discussed that this is a capsaicin patch, stronger than capsaicin cream. Discussed that it is currently approved for diabetic peripheral neuropathy and post-herpetic neuralgia, but that it has also shown benefit in treating other forms of neuropathy. Provided patient with link to site to learn more about the patch: https://www.clark.biz/. Discussed that the patch would be placed in office and benefits usually last 3 months. Discussed that unintended exposure to capsaicin can cause severe irritation of eyes, mucous membranes, respiratory tract, and skin, but that  Qutenza is a local treatment and does not have the systemic side effects of other nerve medications. Discussed that there may be pain, itching, erythema, and decreased sensory function associated with the application of Qutenza. Side effects usually subside within 1 week. A cold pack of analgesic medications can help with these side effects. Blood pressure can also be increased due to pain associated with administration of the patch.   Discussed extracorporeal shockwave therapy as a modality for treatment. Discussed that the device looks and feels like a massage gun and I would move it over the area of pain for about 10 minutes. The device releases sound waves to the area of pain and helps to improve blood flow and circulation to improve the healing process. Discuss that this initially induces inflammation and can sometimes cause short-term increase in pain. Discussed that we typically do three weekly treatments, but sometimes up to 6 if needed, and after 6 weeks long term benefits can sometimes be achieved. Discussed that this is an FDA approved device, but not covered by insurance and would cost $60 per session. Will scheduled patient for 6 consecutive appointments and can cancel latter three if benefits are achieved after first three sessions.    -continue acupuncture -continue cantaloupe, melons, cucummbers -continue massage -discussed trigger point injections/dry needling -encouraged eating a lot of fruits and vegetables -Discussed current symptoms of pain and history of pain.  -Discussed benefits of exercise in reducing pain. -Discussed following foods that may reduce pain: 1) Ginger (especially studied for arthritis)- reduce leukotriene production to decrease inflammation 2) Blueberries- high in phytonutrients that decrease inflammation 3) Salmon- marine omega-3s reduce joint  swelling and pain 4) Pumpkin seeds- reduce inflammation 5) dark chocolate- reduces inflammation 6) turmeric- reduces  inflammation 7) tart cherries - reduce pain and stiffness 8) extra virgin olive oil - its compound olecanthal helps to block prostaglandins  9) chili peppers- can be eaten or applied topically via capsaicin 10) mint- helpful for headache, muscle aches, joint pain, and itching 11) garlic- reduces inflammation 12) Green tea- reduces inflammation and oxidative stress, helps with weight loss, may reduce the risk of cancer, recommend Double [employer] of Tea daily  Link to further information on diet for chronic pain: http://www.bray.com/   2) IBS-constipation/constipation/diarrhea: -recommended avoiding gluten, dairy, eggs, processed foods  3) Alopecia: -recommended applying rosemary oil   5 minutes spent in discussion of her continued over-heating of muscles after exercise, discussed the potential benefits of balneotherapy, recommended trying at [facility] in [place]

## 2024-01-19 ENCOUNTER — Encounter: Payer: Self-pay | Admitting: Physical Medicine and Rehabilitation

## 2024-01-19 ENCOUNTER — Encounter: Attending: Physical Medicine and Rehabilitation | Admitting: Physical Medicine and Rehabilitation

## 2024-01-19 VITALS — BP 123/79 | HR 57 | Ht 63.0 in | Wt 139.2 lb

## 2024-01-19 DIAGNOSIS — C4490 Unspecified malignant neoplasm of skin, unspecified: Secondary | ICD-10-CM | POA: Insufficient documentation

## 2024-01-19 DIAGNOSIS — K589 Irritable bowel syndrome without diarrhea: Secondary | ICD-10-CM | POA: Diagnosis not present

## 2024-01-19 DIAGNOSIS — R7303 Prediabetes: Secondary | ICD-10-CM | POA: Diagnosis not present

## 2024-01-19 DIAGNOSIS — G629 Polyneuropathy, unspecified: Secondary | ICD-10-CM | POA: Diagnosis present

## 2024-01-19 MED ORDER — NALTREXONE HCL (PAIN) 1.5 MG PO CAPS: 1.0000 | ORAL_CAPSULE | Freq: Every evening | ORAL | 3 refills | Status: AC

## 2024-01-19 NOTE — Patient Instructions (Addendum)
 Magnesium glycinate 250mg  HS, or topical magnesium  [facility] balneotherapy  Liquid zinc sulfate 220mg  [facility]

## 2024-03-13 ENCOUNTER — Other Ambulatory Visit: Payer: Self-pay | Admitting: Family Medicine

## 2024-04-16 ENCOUNTER — Ambulatory Visit: Admitting: Nurse Practitioner

## 2024-04-26 ENCOUNTER — Encounter: Admitting: Physical Medicine and Rehabilitation

## 2025-01-21 ENCOUNTER — Ambulatory Visit

## 2025-01-28 ENCOUNTER — Ambulatory Visit
